# Patient Record
Sex: Male | Born: 1995 | Hispanic: No | Marital: Single | Smoking: Never smoker
Health system: Southern US, Academic
[De-identification: ages and names within clinical notes are randomized; demographics above are authoritative.]

---
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
## 2016-01-24 ENCOUNTER — Ambulatory Visit (INDEPENDENT_AMBULATORY_CARE_PROVIDER_SITE_OTHER): Payer: PRIVATE HEALTH INSURANCE

## 2016-01-24 ENCOUNTER — Encounter (INDEPENDENT_AMBULATORY_CARE_PROVIDER_SITE_OTHER): Payer: Self-pay

## 2016-01-24 VITALS — BP 103/90 | HR 90 | Temp 98.3°F | Resp 16 | Ht 70.0 in | Wt 149.4 lb

## 2016-01-24 DIAGNOSIS — J0100 Acute maxillary sinusitis, unspecified: Principal | ICD-10-CM

## 2016-01-24 MED ORDER — FLUTICASONE PROPIONATE 50 MCG/ACTUATION NASAL SPRAY,SUSPENSION
2.0000 | Freq: Every day | NASAL | 0 refills | Status: DC
Start: 2016-01-24 — End: 2016-03-05

## 2016-01-24 MED ORDER — AMOXICILLIN 875 MG-POTASSIUM CLAVULANATE 125 MG TABLET
1.00 | ORAL_TABLET | Freq: Two times a day (BID) | ORAL | 0 refills | Status: AC
Start: 2016-01-24 — End: 2016-01-31

## 2016-01-24 MED ORDER — FEXOFENADINE 180 MG TABLET
180.0000 mg | ORAL_TABLET | Freq: Every day | ORAL | 0 refills | Status: DC
Start: 2016-01-24 — End: 2017-08-09

## 2016-01-24 NOTE — Patient Instructions (Addendum)
[place]     Operated by [facility]  [place], [place] [address]  Phone: [phone]  Fax: [phone]  [email or web address]  Twitter [email or web address]   Closed all  Holidays        Attending Caregiver: [facility]    Today's orders:   Orders Placed This Encounter    fluticasone (FLONASE) 50 mcg/actuation Nasal Spray, Suspension    fexofenadine (ALLEGRA) 180 mg Oral Tablet    amoxicillin-pot clavulanate (AUGMENTIN) 875-125 mg Oral Tablet         Prescription(s) E-Rx to:  [place], [place]    Future appts with Student Health  Visit date not found    [place]. THIS IS ONE EASY WAY TO COMMUNICATE WITH [facility].  SEE THE CODE ON THIS FORM FOR ACCESS.    -----------------------------PRIVACY INFORMATION-----------------------------------------------  As a [place] student, regardless of your age, we cannot discuss your personal health information with a parent, spouse, family member or anyone else without your expressed consent.    This policy does not include:  Individuals who would have a legitimate reason to access your records and information to assist in your care under the provisions of HIPAA ([place]) law;   Individuals with whom you have previously given expressed written consent to do so, such a legal guardian or Power of Attorney.    No one can access your My[facility]Chart, unless you give them your account sign on and password. You will receive emails from [place].  This means anyone who has access to the email account you provided can see this notification. There will be no private medical information included in these emails. This notification of  new medical information  available in your My[facility]Chart, may be information that you do not want others to know.      _______________________________________________________________________    If your symptoms persist, worsen or you develop any new or concerning symptoms please call [place] for at [phone] for a follow up appointment.   If your symptoms are severe, go immediately to the emergency department or call 911.  Please check with your health insurance coverage for these types of visits.   Please keep all follow up visit recommended at today's visit.    If you received x-rays during your visit, be aware that the final and formal interpretation of those films by a radiologist will occur after your discharge.  If there is a significant discrepancy identified after your discharge, we will contact you at the telephone number provided during registration.  These results are available for your review on My[facility]Chart.    Please refer to your My[facility]Chart for lab test results. Lab test results related to HIV, STI's and pregnancy are considered private and are therefore not immediately visible in your My[facility]Chart, we may still be able to send a generic MyChart message for these results.  If you have cultures pending for sexually transmitted diseases, you will be contacted by phone.     Positive cultures are reported to the [place] Department of Health, as required by state law. These results are considered private on My[facility]Chart and can only be released by the provider.We will not contact you if the results are negative.    It is very important that we have a phone number that is the single best way to contact you in the event that we become aware of important clinical information  or concerns after your discharge.  If the phone number you provided at registration is NOT this number you should inform staff and registration prior to leaving.     Please call [facility] at [phone] with any further questions.    Instructions discussed with patient upon discharge by clinical staff with all questions answered.    Berdine Addison, DO 01/24/2016, 15:27    Sinusitis, Adult  Sinusitis is redness, soreness, and puffiness (inflammation) of the air pockets in the bones of your face (sinuses). The redness, soreness, and puffiness can cause air and mucus to get trapped in your sinuses. This can allow germs to grow and cause an infection.   HOME CARE    Drink enough fluids to keep your pee (urine) clear or pale yellow.   Use a humidifier in your home.   Run a hot shower to create steam in the bathroom. Sit in the bathroom with the door closed. Breathe in the steam 3-4 times a day.   Put a warm, moist washcloth on your face 3-4 times a day, or as told by your doctor.   Use salt water sprays (saline sprays) to wet the thick fluid in your nose. This can help the sinuses drain.   Only take medicine as told by your doctor.  GET HELP RIGHT AWAY IF:    Your pain gets worse.   You have very bad headaches.   You are sick to your stomach (nauseous).   You throw up (vomit).   You are very sleepy (drowsy) all the time.   Your face is puffy (swollen).   Your vision changes.   You have a stiff neck.   You have trouble breathing.  MAKE SURE YOU:    Understand these instructions.   Will watch your condition.   Will get help right away if you are not doing well or get worse.     This information is not intended to replace advice given to you by your health care provider. Make sure you discuss any questions you have with your health care provider.     Document Released: 05/17/2008 Document Revised: 12/20/2014 Document Reviewed: 07/04/2012  Elsevier Interactive Patient Education [employer]

## 2016-01-24 NOTE — Progress Notes (Addendum)
History of Present Illness: Jason Kennedy is a 20 y.o. male who presents to the Student [facility]/Urgent Care today with chief complaint of    Chief Complaint     Sinus Problem drainage.  Symptoms for about 1 week       Sore Throat             Location: sinus  Quality: congestion  Onset: week ago  Severity: mild  Timing: constant  Context: Pt presents to Student [facility] with c/o sinus congestion that began a week ago. Pt associates sore throat, productive cough, chills, and eye redness. Pt has been taking OTC medications Dayquil and Nyquil without relief.   Associated symptoms: eye redness, nasal congestion, sore throat    I reviewed and confirmed the patient's past medical history taken by the nurse or medical assistant with the addition of the following:    Past Medical History:    No past medical history on file.    Past Surgical History:    No past surgical history on file.    Allergies:  No Known Allergies  Medications:    Current Outpatient Prescriptions   Medication Sig    amoxicillin-pot clavulanate (AUGMENTIN) 875-125 mg Oral Tablet Take 1 Tab by mouth Every 12 hours for 7 days    DM/P-EPHED/ACETAMINOPH/DOXYLAM (NYQUIL ORAL) Take by mouth    DM/PSEUDOEPHED/ACETAMINOPHEN (VICKS DAYQUIL ORAL) Take by mouth    fexofenadine (ALLEGRA) 180 mg Oral Tablet Take 1 Tab (180 mg total) by mouth Once a day    fluticasone (FLONASE) 50 mcg/actuation Nasal Spray, Suspension 2 Sprays by Each Nostril route Once a day     Social History:    Social History   Substance Use Topics    Smoking status: Never Smoker    Smokeless tobacco: None    Alcohol use No     Family History: No significant family history.  Family History   Problem Relation Age of Onset    Healthy Father     Healthy Brother      Review of Systems:    General: chills  ENT: sore throat, sinus congestion  Eyes:  redness  Pulmonary:   productive cough  All other review of systems were negative    Physical Exam:  Vital signs:   Vitals:    01/24/16 1505   BP:  103/90   Pulse: 90   Resp: 16   Temp: 36.8 C (98.3 F)   TempSrc: Thermal Scan   SpO2: 97%   Weight: 67.8 kg (149 lb 6.4 oz)   Height: 1.778 m ( )     Body mass index is 21.44 kg/(m^2). 47 %ile (Z= -0.07) based on CDC 2-20 Years weight-for-age data using vitals from 01/24/2016.  No LMP for male patient.    General:  Well appearing and No acute distress  Head:  Normocephalic  Eyes:  Normal lids/lashes and normal conjunctiva  ENT:  normal EAC's, normal TM's, MMM, mildly erythematous turbinates, mildly erythematous pharynx with cobblestoning appearance, normal tonsils and normal tongue/uvula, maxillary sinus tenderness bilaterally   Neck:  supple  Pulmonary:  clear to auscultation bilaterally, no wheezes, no rales and no rhonchi  Cardiovascular:  regular rate/rhythm, normal S1/S2 and no murmur/rub/gallop  Skin:  warm/dry and no rash  Psychiatric:  Appropriate affect and behavior  Neurologic:   Alert and oriented x 3  Hem/Lymph:  No cervical lymphadenopathy     Data Reviewed:    Not applicable    Course: Condition at discharge: Good  Differential Diagnosis: viral sinusitis vs bacterial sinusitis vs URI     Assessment:   1. Acute non-recurrent maxillary sinusitis        Plan:    Orders Placed This Encounter    fluticasone (FLONASE) 50 mcg/actuation Nasal Spray, Suspension    fexofenadine (ALLEGRA) 180 mg Oral Tablet    amoxicillin-pot clavulanate (AUGMENTIN) 875-125 mg Oral Tablet         Most likely viral vs bacterial sinusitis  Pt educated on Rx usage and symptomatic care.   Discussed conservative tx including steam inhalation, neti-pot, tea with honey and humidifier  Pt only to take Augmentin if symptoms are not improving in the next 3-4 days.     Go to Emergency Department immediately for further work up if any concerning symptoms.  Plan was discussed and patient verbalized understanding.  If symptoms are worsening or not improving the patient should return to the Urgent Care/Student Health for further  evaluation.    I am scribing for, and in the presence of, Dr. Edwyna Shell for services provided on 01/24/2016.  Alba Destine, SCRIBE     Alba Destine, SCRIBE 01/24/2016, 15:28    I personally performed the services described in this documentation, as scribed  in my presence, and it is both accurate  and complete.    Berdine Addison, DO

## 2016-03-05 ENCOUNTER — Ambulatory Visit (INDEPENDENT_AMBULATORY_CARE_PROVIDER_SITE_OTHER): Payer: Self-pay | Admitting: EMERGENCY MEDICINE

## 2016-03-05 ENCOUNTER — Encounter (INDEPENDENT_AMBULATORY_CARE_PROVIDER_SITE_OTHER): Payer: Self-pay | Admitting: EMERGENCY MEDICINE

## 2016-03-05 VITALS — BP 119/80 | HR 76 | Temp 98.0°F | Resp 16 | Ht 70.0 in | Wt 150.0 lb

## 2016-03-05 DIAGNOSIS — Z7184 Encounter for health counseling related to travel: Secondary | ICD-10-CM

## 2016-03-05 DIAGNOSIS — Z7189 Other specified counseling: Secondary | ICD-10-CM

## 2016-03-05 NOTE — Patient Instructions (Signed)
[facility]  [place]   [facility] by [facility]  [address]  [place], [place] [id number]  Phone: [phone]  Fax: [phone]  [email or web address]  Closed all  Blunt Holidays          Attending Caregiver: Jama FlavorsKathleen H Marisabel Macpherson, MD    Today's orders: No orders of the defined types were placed in this encounter.        Prescription(s) E-Rx to:  [facility] - [place], [place] - [address]    Future appts with the Travel Clinic  Visit date not found    It is very important that we have a primary phone number as well as an emergency contact in your chart.  This is the single best way to contact you in the event that we become aware of important clinical information or concerns after your discharge.  If the phone number you provided at registration is NOT this number you should inform staff and registration prior to leaving.      Your treatment plan today was focused on identifying and preventing potential illnesses that you may encounter on your travel as well as identifying any specific travel needs.  You may have additional visits scheduled to complete any necessary vaccinations as well as medications that were sent to the pharmacy.         If you have been prescribed a medication to prevent Malaria, please note that no medication is 100% effective and consistent use of insect repellant, appropriate clothing, and environmental barriers are vital while traveling day and night.    There are always potential side effects and interactions with any medication or vaccination.  If you develop any concerning symptoms, please contact the clinic immediately or go to the nearest emergency room for evaluation.    If you received x-rays during your visit, be aware that the final and formal interpretation of those films by a radiologist may occur after your discharge.  If there is a significant discrepancy identified after your discharge,  we will contact you at the telephone number provided at registration.  You will also be identified of any lab findings as we become aware.    If you are over 19 year old, we cannot discuss your personal health information with a parent, spouse, family member, or anyone else without your consent.  This does not include those who have legitimate access to your records and information to assist in your care under the provisions of HIPAA [facility](Health Insurance Portability and Accountability Act) law, or those to whom you have previously given written consent to do so, such a legal guardian or Power of [place].      Instructions are discussed with patient upon discharge by clinical staff with all questions answered.  Please call [facility] [phone]) if any further questions develop.      If you develop any symptoms prior to travel or immediately following travel, please contact your primary care physician or go to your nearest emergency department to be evaluated.  Be sure to tell your health care provider about your travel.    Jama FlavorsKathleen H Jovonna Nickell, MD 03/05/2016, 08:30

## 2016-03-05 NOTE — Progress Notes (Signed)
Travel, Health/Educ Bldg  [address]  [place] [id number]  [phone]  Travel Clinic    Patient Name:  Jason Kennedy       MRN:  [id number]   DOB: 05/22/1996   Date of Service:  03/05/2016     Date of Departure: 04/16/2016  [place]  Length of Stay: 10 day   Living Area: Urban  Living Styles:Hotel    Allergies: No Known Allergies  Allergic to chicken feathers/eggs:no    History reviewed. No pertinent past medical history.  Seasonal allergies    History reviewed. No pertinent surgical history.      Family History   Problem Relation Age of Onset    Healthy Father     Healthy Brother     Healthy Mother          Social History   Substance Use Topics    Smoking status: Never Smoker    Smokeless tobacco: Never Used    Alcohol use No       Current Medications:  Outpatient Prescriptions Marked as Taking for the 03/05/16 encounter (Office Visit) with Jama Flavorsichardson, Ezabella Teska H, MD   Medication Sig    fexofenadine (ALLEGRA) 180 mg Oral Tablet Take 1 Tab (180 mg total) by mouth Once a day       Immunizations:    There is no immunization history on file for this patient.    Is Patient currently under the care of a physician: no  Patient has a history of : N/A  Does patient feel ill today: No    Patients gender:  Male   Vitals:    03/05/16 0816   BP: 119/80   Pulse: 76   Resp: 16   Temp: 36.7 C (98 F)   TempSrc: Thermal Scan   SpO2: 98%   Weight: 68 kg (150 lb)   Height: 1.778 m (5\' 10" )     Body mass index is 21.52 kg/(m^2).  No LMP for male patient.      Instructions/Counseling: Safety and Security  Patient understands all counseling and risks of travel and medication.      Handouts: Travax- specific to destination and Culturegram- specific to destination     Immunizations:   There is no immunization history on file for this patient.    Additional Information: No vaccines given today.    No orders of the defined types were placed in this encounter.      (Z71.89) Travel advice  encounter  (primary encounter diagnosis)  Plan:       Discussed general safety and security including traveling in group and avoidance of alcohol and drugs. Advised to wear helmet and seatbelt when appropriate. Up to date on required immunizations per her record.           Recommended Follow-Up Instructions:  N/A    Billing level for today's visit Travel established group without exam    Jama FlavorsKathleen H Maliaka Brasington, MD 03/05/2016, 08:32

## 2016-03-07 ENCOUNTER — Other Ambulatory Visit: Payer: Self-pay

## 2016-08-09 ENCOUNTER — Encounter (INDEPENDENT_AMBULATORY_CARE_PROVIDER_SITE_OTHER): Payer: Self-pay

## 2016-08-09 ENCOUNTER — Ambulatory Visit (INDEPENDENT_AMBULATORY_CARE_PROVIDER_SITE_OTHER): Payer: PRIVATE HEALTH INSURANCE

## 2016-08-09 VITALS — BP 130/78 | HR 103 | Temp 97.0°F | Resp 18 | Ht 70.0 in | Wt 143.3 lb

## 2016-08-09 DIAGNOSIS — L723 Sebaceous cyst: Secondary | ICD-10-CM

## 2016-08-09 DIAGNOSIS — L089 Local infection of the skin and subcutaneous tissue, unspecified: Secondary | ICD-10-CM

## 2016-08-09 MED ORDER — AMOXICILLIN 875 MG-POTASSIUM CLAVULANATE 125 MG TABLET
1.00 | ORAL_TABLET | Freq: Two times a day (BID) | ORAL | 0 refills | Status: DC
Start: 2016-08-09 — End: 2017-08-09

## 2016-08-09 NOTE — Patient Instructions (Addendum)
Epidermoid Cyst (Sebaceous Cyst), Infected (Antibiotic Treatment)  You have an epidermoid cyst. This is a small, painless lump under your skin. An epidermoid cyst(often called a sebaceous cyst, epidermal cyst, or epidermal inclusion cyst) is a term most often used for 2 similar types of cysts:   Epidermoid cysts. These cysts form slowly under the skin. They can be found on most parts of the body. But they are most often found on areas with more hair such as the scalp, face, upper back, and genitals.   Pilar cysts. These are similar to epidermoid cysts. But they start from a different part of the hair follicle. They are more likely to be on the scalp.  Here are some general factsabout these cysts:   A cyst is a sac filled with material that is often cheesy, fatty, oily, or stringy. The material inside can be thick. Or it can be a liquid.   You can usually move the cyst slightly if you try.   The cysts can be smaller than a pea or as large as a few inches.   The cysts are usually not painful, unless they become inflamed or infected.   The area around the cyst may smell bad. If the cyst breaks open, the material inside it often smells bad as well.  Your cyst became infected and your healthcare provider wanted to treat it with antibiotics. If the antibiotics dont clear up the infection, the cyst will need to be drained by making a small cut (incision). Local anesthesia will be used to numb the area.  Home care   Resist the temptation to squeeze or pop the cyst, stick a needle in it, or cut it open. This often leads to a worsening infection and scarring.   Take the antibiotic as directed until it is all used up.   Soak the affected area in hot water or apply a hot pack (a thin, clean towel soaked in hot water) for 20 minutes at a time. Do this 3 to 4 times a day.   Apply antibiotic cream or ointment 3 times a day.   You may use over-the-counter pain medicine to control pain, unless another medicine was  given. If you have chronic liver or kidney disease or ever had a stomach ulcer or GI bleeding, talk with your healthcare provider before using these medicines.  Prevention  Once this infection has healed, reduce the risk of future infections by:   Keeping the cyst area clean by bathing or showering daily   Avoiding tight-fitting clothing in the cyst area  Follow-up care  Follow up with your healthcare provider, or as advised. If a gauze packing was put in your wound, it should be removed in a few days as advised by your healthcare provider. Check your wound every day for the signs listed below.  When to seek medical advice  Call your healthcare provider right away if any of these occur:   Pus coming from the cyst   Increasing redness around the wound   Increasing local pain or swelling   Fever of 100.23F (38C) or higher, or as directed by your provider  Date Last Reviewed: 09/17/2015   2000-2017 [employer] [address], [place], [place] [id number]. All rights reserved. This information is not intended as a substitute for professional medical care. Always follow your healthcare professional's instructions.        [facility]     [facility] by [facility]  [address]  [address]  [place], [place] [address]  Phone: [phone]  Fax: [phone]  [email or web address]  Twitter [email or web address]   Closed all  Holidays        Attending Caregiver: [facility]    Today's orders:   Orders Placed This Encounter    amoxicillin-pot clavulanate (AUGMENTIN) 875-125 mg Oral Tablet         Prescription(s) E-Rx to:  [facility] - [place], [place] - [address]    Future appts with Student Health  Visit date not found    PLEASE ACTIVATE YOUR [place] MYCHART. THIS IS ONE EASY WAY TO COMMUNICATE WITH [facility].  SEE THE CODE ON THIS FORM FOR ACCESS.    -----------------------------PRIVACY  INFORMATION-----------------------------------------------  As a [place] student, regardless of your age, we cannot discuss your personal health information with a parent, spouse, family member or anyone else without your expressed consent.    This policy does not include:  Individuals who would have a legitimate reason to access your records and information to assist in your care under the provisions of HIPAA [facility] Portability and Accountability Act) law;   Individuals with whom you have previously given expressed written consent to do so, such a legal guardian or Power of [address].    No one can access your My[facility]Chart, unless you give them your account sign on and password. You will receive emails from MYWVUChart.  This means anyone who has access to the email account you provided can see this notification. There will be no private medical information included in these emails. This notification of  new medical information  available in your My[facility]UChart, may be information that you do not want others to know.   _______________________________________________________________________    If your symptoms persist, worsen or you develop any new or concerning symptoms please call [facility] for at [phone] for a follow up appointment.   If your symptoms are severe, go immediately to the emergency department or call 911.  Please check with your health insurance coverage for these types of visits.   Please keep all follow up visit recommended at today's visit.    If you received x-rays during your visit, be aware that the final and formal interpretation of those films by a radiologist will occur after your discharge.  If there is a significant discrepancy identified after your discharge, we will contact you at the telephone number provided during registration.  These results are available for your review on My[facility]Chart.    Please refer to your My[facility]UChart for lab test results. Lab test results related to  HIV, STI's and pregnancy are considered private and are therefore not immediately visible in your MyWVUChart, we may still be able to send a generic MyChart message for these results.  If you have cultures pending for sexually transmitted diseases, you will be contacted by phone.     Positive cultures are reported to the [facility] Department of Health, as required by state law. These results are considered private on My[facility]UChart and can only be released by the provider.We will not contact you if the results are negative.    It is very important that we have a phone number that is the single best way to contact you in the event that we become aware of important clinical information or concerns after your discharge.  If the phone number you provided at registration is NOT this number you should inform staff and registration prior to leaving.     Please call [facility] [facility] at [phone] with any further questions.    Instructions discussed with patient upon discharge by clinical staff with all questions answered.    Darliss Ridgelodd Owen Hugh Kamara, MD 08/09/2016, 14:33

## 2016-08-09 NOTE — Progress Notes (Signed)
History of Present Illness: Jason Kennedy is a 20 y.o. male who presents to the [facility] today with chief complaint of    Chief Complaint     Cyst           .   Pt presents to [facility] with c/o a lump on scrotum onset yesterday. He associates soreness to the touch and redness. Pt denies fever, chills, drainage, dysuria, urinary frequency, and urethral discharge. He reports that he has not had any known injuries or a Hx of similar sxs. Pt notes that he has applied warm compresses without improvement and that he has not taken any OTC medications for pain relief.    Functional Health Screening:     Patient is under 18:  No   Have you had a recent unexplained weight loss or gain?:  No   Because we are aware of abuse and domestic violence today, we ask all patients: Are you being hurt, hit, or frightened by anyone at your home or in your life?:  No   Do you have any basic needs within your home that are not being met? (such as Food, Shelter, Games developer, Transportation):  No   Patient is under 18 and therefore has no Advance Directives:  No   Patient has:  No Advance   Patient has Advance Directive:  No   Patient offered:  Refused Packet          I reviewed and confirmed the patient's past medical history taken by the nurse or medical assistant with the addition of the following:    Past Medical History:    No past medical history on file.      Past Surgical History:    History reviewed. No pertinent surgical history.      Allergies:  No Known Allergies  Medications:    Current Outpatient Prescriptions   Medication Sig    amoxicillin-pot clavulanate (AUGMENTIN) 875-125 mg Oral Tablet Take 1 Tab by mouth Every 12 hours    fexofenadine (ALLEGRA) 180 mg Oral Tablet Take 1 Tab (180 mg total) by mouth Once a day     Social History:    Social History   Substance Use Topics    Smoking status: Never Smoker    Smokeless tobacco: Never Used    Alcohol use No     Family History: No significant family  history.  Family History   Problem Relation Age of Onset    Healthy Father     Healthy Brother     Healthy Mother        Review of Systems:    General: no fever and no chills  Genitourinary:  Lump on scrotum, soreness, redness, no dysuria, no frequency and no drainage, and no urethral discharge  All other review of systems were negative    Physical Exam:  Vital signs:   Vitals:    08/09/16 1401   BP: 130/78   Pulse: (!) 103   Resp: 18   Temp: 36.1 C (97 F)   TempSrc: Tympanic   SpO2: 99%   Weight: 65 kg (143 lb 4.8 oz)   Height: 1.778 m (5' 10" )     Body mass index is 20.56 kg/(m^2). 33 %ile (Z= -0.43) based on CDC 2-20 Years weight-for-age data using vitals from 08/09/2016.  No LMP for male patient.    General:  No acute distress  Head:  Normocephalic and atraumatic   Eyes:  Normal lids/lashes, EOMI and normal conjunctiva  Pulmonary:  Equal breath sounds, no wheezes, no rales and no rhonchi   Cardiovascular:  Regular rate/rhythm without murmur, normal radial pulses without peripheral edema    Genitourinary :1 cm cystic subcutaneous nodule at the R upper anterior aspect of scrotum which is mildly erythematous, indurated, nonfluctuant, and no drainage, otherwise normal male genitalia  Skin:  No visible rash on exposed skin  Psychiatric:  Appropriate affect and behavior  Neurologic:   Alert and oriented x 3    Data Reviewed:    Not applicable    Course: Condition at discharge: Good     Differential Diagnosis: sebaceous cyst vs infected sebaceous cyst vs abscess    Assessment:   1. Infected sebaceous cyst        Plan:    Orders Placed This Encounter    amoxicillin-pot clavulanate (AUGMENTIN) 875-125 mg Oral Tablet        Advised to take warm soaks and apply warm compresses.  Education on Augmentin usage and OTC pain management medications.  See printed discharge instructions.    Go to Emergency Department immediately for further work up if any concerning symptoms.  Plan was discussed and patient verbalized  understanding.  If symptoms are worsening or not improving the patient should return to the Urgent Care/Student [facility] for further evaluation.    I am scribing for, and in the presence of, Dr. Vergia Alcon for services provided on 08/09/2016.  Hallie Gunnoe, SCRIBE       [place], [place] 08/09/2016, 14:43    I personally performed the services described in this documentation, as scribed  in my presence, and it is both accurate  and complete.    Axel Filler, MD

## 2016-12-12 ENCOUNTER — Other Ambulatory Visit: Payer: Self-pay

## 2016-12-31 ENCOUNTER — Ambulatory Visit (INDEPENDENT_AMBULATORY_CARE_PROVIDER_SITE_OTHER): Payer: PRIVATE HEALTH INSURANCE

## 2016-12-31 ENCOUNTER — Encounter (INDEPENDENT_AMBULATORY_CARE_PROVIDER_SITE_OTHER): Payer: Self-pay

## 2016-12-31 VITALS — BP 114/79 | HR 91 | Temp 98.0°F | Resp 16 | Ht 71.0 in | Wt 149.9 lb

## 2016-12-31 DIAGNOSIS — R6889 Other general symptoms and signs: Principal | ICD-10-CM

## 2016-12-31 MED ORDER — ONDANSETRON 4 MG DISINTEGRATING TABLET
4.0000 mg | ORAL_TABLET | Freq: Three times a day (TID) | ORAL | 0 refills | Status: DC | PRN
Start: 2016-12-31 — End: 2017-04-11

## 2016-12-31 NOTE — Progress Notes (Addendum)
Attending: Dr. Ladonna Snide  History of Present Illness: Jason Kennedy is a 21 y.o. male who presents to the [facility] today with chief complaint of    Chief Complaint     Fever     Vomiting     Generalized Body Aches             Pt presents to [facility] with c/o flu-like symptoms that began 2 days ago. Pt states he has felt febrile with chills. He vomited once last night. Pt admits that he has not had an appetite but has tried to increase water intake. He states that with eating and drinking he feels nauseated. Pt associates otalgia, myalgias and headache. Pt denies any known sick contacts. He did not receive the influenza vaccination. He has taken Dayquil with mild relief. Pt denies cough, sore throat, sinus congestion, and diarrhea.     On average, how many days/week do you engage in moderate to vigorous physical activity (like brisk walking)?: 5 Days  On average, how many minutes do you enage in physical activity at this level?: 30-45 Minutes/day  Total activity days/week x minutes/day =: 225    Functional Health Screening:     Patient is under 18:  No   Have you had a recent unexplained weight loss or gain?:  No   Because we are aware of abuse and domestic violence today, we ask all patients: Are you being hurt, hit, or frightened by anyone at your home or in your life?:  No   Do you have any basic needs within your home that are not being met? (such as Food, Shelter, Games developer, Transportation):  No   Patient is under 18 and therefore has no Advance Directives:  No   Patient has:  No Advance   Patient has Advance Directive:  No   Patient offered:  Refused Packet        I reviewed and confirmed the patient's past medical history taken by the nurse or medical assistant with the addition of the following:    Past Medical History:    History reviewed. No pertinent past medical history.    Past Surgical History:    Past Surgical History Pertinent Negatives:   Procedure Date Noted   . HX  TONSILLECTOMY 09/11/2015       Allergies:  No Known Allergies  Medications:    Current Outpatient Prescriptions   Medication Sig   . amoxicillin-pot clavulanate (AUGMENTIN) 875-125 mg Oral Tablet Take 1 Tab by mouth Every 12 hours   . fexofenadine (ALLEGRA) 180 mg Oral Tablet Take 1 Tab (180 mg total) by mouth Once a day   . ondansetron (ZOFRAN ODT) 4 mg Oral Tablet, Rapid Dissolve Take 1 Tab (4 mg total) by mouth Every 8 hours as needed for nausea/vomiting     Social History:    Social History   Substance Use Topics   . Smoking status: Never Smoker   . Smokeless tobacco: Never Used   . Alcohol use No     Family History: No significant family history.  Family Medical History     Problem Relation (Age of Onset)    Healthy Father, Brother, Mother        Review of Systems:    General: subjective fever, chills, myalgias, decreased appetite  ENT:  Otalgia, no sore throat, no sinus congestion  Pulmonary:  No cough  Gastrointestinal: nausea, vomiting x1, no diarrhea  Neurologic: headache  All other review of systems were negative  Physical Exam:  Vital signs:   Vitals:    12/31/16 1414   BP: 114/79   Pulse: 91   Resp: 16   Temp: 36.7 C (98 F)   TempSrc: Thermal Scan   SpO2: 98%   Weight: 68 kg (149 lb 14.4 oz)   Height: 1.803 m (5' 11" )     Body mass index is 20.91 kg/(m^2). 42 %ile (Z= -0.20) based on CDC 2-20 Years weight-for-age data using vitals from 12/31/2016.  No LMP for male patient.    General:  Well appearing and no acute distress  Head:  Normocephalic  Eyes:  Normal lids/lashes and normal conjunctiva  ENT:  Normal EAC's, normal TM's, MMM, normal pharynx and normal tongue/uvula  Neck:  Supple   Pulmonary:  Clear to auscultation bilaterally, no wheezes, no rales and no rhonchi  Cardiovascular:  Regular rate/rhythm, normal S1/S2 and no murmur/rub/gallop  Gastrointestinal:  Non-distended, normal bowel sounds, soft, non-tender, and no guarding  Skin:  Warm/dry and no rash  Psychiatric:  Appropriate affect and  behavior  Neurologic:   Alert and oriented  Hem/Lymph:  No cervical lymphadenopathy     Data Reviewed:    Point-of-care testing:     Rapid Influenza A: Negative  Rapid Influenza B: Negative                    Course: Condition at discharge: Good     Differential Diagnosis: influenza vs viral gastroenteritis vs bacterial gastroenteritis     Assessment:   1. Flu-like symptoms        Plan:    Orders Placed This Encounter   . POCT INFLUENZA A & B   . ondansetron (ZOFRAN ODT) 4 mg Oral Tablet, Rapid Dissolve         Educated on Rx usage and symptomatic care such as increase fluids, rest, and tylenol.   Given excuse for school.     Go to Emergency Department immediately for further work up if any concerning symptoms.  Plan was discussed and patient verbalized understanding.  If symptoms are worsening or not improving the patient should return to the [facility] for further evaluation.    I am scribing for, and in the presence of, Bruna Potter, DNP for services provided on 12/31/2016.  Sanjuana Mae, SCRIBE     Sanjuana Mae, SCRIBE 12/31/2016, 14:52  I personally performed the services described in this documentation, as scribed  in my presence, and it is both accurate  and complete.    Beth Vicki Mallet, APRN,NP-C  The supervising physician was physically present in Urgent Care and available for consultation, and did not participate in the care of this patient.

## 2016-12-31 NOTE — Patient Instructions (Signed)
Influenza (Adult)    Influenza is also called the flu. It is a viral illness that affects the air passages of your lungs. It is different from the common cold. The flu can easily be passed from one to person to another. It may be spread through the air by coughing and sneezing. Or it can be spread by touching the sick person and then touching your own eyes, nose, or mouth.  The flu starts 1 to 3 days after you are exposed to the flu virus. It may lastfor 1 to 2 weeks but many people feel tired or fatigued for many weeks afterward. You usually don't need to take antibiotics unless you have a complication. This might be an ear or sinus infection or pneumonia.  Symptoms of the flu may be mild or severe. They can include extreme tiredness (wanting to stay in bed all day), chills, fevers, muscle aches, soreness with eye movement, headache, and a dry, hacking cough.  Home care  Follow these guidelines when caring for yourself at home:   Avoid being around cigarette smoke, whether yours or other people's.   Acetaminophen or ibuprofen will help ease your fever, muscle aches, and headache. Don't give aspirin to anyone younger than 30 who has the flu. Aspirin can harm the liver.   Nausea and loss of appetite are common with the flu. Eat light meals. Drink 6 to 8 glasses of liquids every day. Good choices are water, sport drinks, soft drinks without caffeine, juices, tea, and soup. Extra fluids will also help loosen secretions in your nose and lungs.   Over-the-counter cold medicines will not make the flu go away faster. But the medicines may help with coughing, sore throat, and congestion in your nose and sinuses. Don't use a decongestant if you have high blood pressure.   Stay home until your fever has been gone for at least 24 hours without using medicine to reduce fever.  Follow-up care  Follow up with your healthcare provider, or as advised, if you are not getting better over the next week.  If you are age 41 or  older, talk with your provider about getting a pneumococcal vaccine every 5 years. You should also get this vaccine if you have chronic asthma or COPD. All adults should get a flu vaccine every fall. Ask your provider about this.  When to seek medical advice  Call your healthcare provider right away if any of these occur:   Cough with lots of colored mucus (sputum) or blood in your mucus   Chest pain, shortness of breath, wheezing, or trouble breathing   Severe headache, or face, neck, or ear pain   New rashwith fever   Fever of 100.2F (38C)or higher, or asdirected by your healthcare provider   Confusion, behavior change, or seizure   Severe weakness or dizziness   You get a newfever or cough after getting better for a few days  Date Last Reviewed: 12/14/2015   2000-2017 [employer], [place] [address]. All rights reserved. This information is not intended as a substitute for professional medical care. Always follow your healthcare professional's instructions.     [facility]      Operated by [facility]  [address] [place], [place] [address]  Phone: [phone])  Fax: [phone]  [email or web address]  Open Daily 8:00a - 8:00p    Closed Thanksgiving and Christmas Day  [facility]  Operated by [facility]  [address]  [facility]  [place], [place] [address]  Phone: [phone])  Fax: [phone]  [email or web address]  Open M-F  8:00a - 8:00p  Sat              10:00a - 4:00p  Sun             Closed  Closed all [place] Holidays        Attending Caregiver: Verta Ellen, APRN,NP-C      Today's orders:   Orders Placed This Encounter   . POCT INFLUENZA A & B   . ondansetron (ZOFRAN ODT) 4 mg Oral Tablet, Rapid Dissolve        Prescription(s) E-Rx to:  [facility] - [place], [place] - [address]     ________________________________________________________________________  Short Term Disability and Family Medical Leave Act  [facility] does NOT provide assistance with any disability applications.  If you feel your medical condition requires you to be on disability, you will need to follow up with  your primary care physician or a specialist.  We apologize for any inconvenience.    For Medication Prescribed by [facility]:  As an Urgent Care facility, our clinic does NOT offer prescription refills over the telephone.    If you need more of the medication one of our medical providers prescribed, you will  either need to be re-evaluated by [place] or see your primary care physician.    ________________________________________________________________________      It is very important that we have a phone number.  This is the single best way to contact you in the event that we become aware of important clinical information or concerns after your discharge.  If the phone number you provided at registration is NOT this number you should inform staff and registration prior to leaving.      Your treatment and evaluation today was focused on identifying and treating potentially emergent conditions based on your presenting signs, symptoms, and history.  The resulting initial clinical impression and treatment plan is not intended to be definitive or a substitute for a full physical examination and evaluation by your primary care provider.  If your symptoms persist, worsen, or you develop any new or concerning symptoms, you need to be evaluated.      If you received x-rays during your visit, be aware that the final and formal interpretation of those films by a radiologist may occur after your discharge.  If there is a significant discrepancy identified after your discharge, we will contact you at the telephone number provided at registration.      If you received a pelvic exam, you may have cultures pending for sexually  transmitted diseases.  Positive cultures are reported to the [facility] Department of Health as required by state law.  You may contact the Health Information Management Office of [facility] to get a copy of your results.     If you are over 49 year old, we cannot discuss your personal health information with a parent, spouse, family member, or anyone else without your consent.  This does not include those who have legitimate access to your records and information to assist in your care under the provisions of HIPAA [facility] Portability and Accountability Act) law, or those to whom you have previously given written consent to do so, such a legal guardian or Power of [place].      Instructions are  discussed with patient upon discharge by clinical staff with all questions answered.  Please call [facility] [phone]) if any further questions develop.  Go immediately to the emergency department if any concerns or worsening symptoms.      Verta EllenBeth Ann Zoriah Pulice, APRN,NP-C 12/31/2016, 14:39

## 2017-04-11 ENCOUNTER — Ambulatory Visit (INDEPENDENT_AMBULATORY_CARE_PROVIDER_SITE_OTHER): Payer: Self-pay | Admitting: Emergency Medicine

## 2017-04-11 ENCOUNTER — Encounter (INDEPENDENT_AMBULATORY_CARE_PROVIDER_SITE_OTHER): Payer: Self-pay | Admitting: Emergency Medicine

## 2017-04-11 VITALS — BP 120/88 | HR 51 | Temp 98.3°F | Resp 18 | Ht 70.0 in | Wt 154.8 lb

## 2017-04-11 DIAGNOSIS — Z7184 Encounter for health counseling related to travel: Secondary | ICD-10-CM

## 2017-04-11 DIAGNOSIS — Z7189 Other specified counseling: Secondary | ICD-10-CM

## 2017-04-11 DIAGNOSIS — Z23 Encounter for immunization: Secondary | ICD-10-CM

## 2017-04-11 MED ORDER — AZITHROMYCIN 500 MG TABLET
ORAL_TABLET | ORAL | 0 refills | Status: DC
Start: 2017-04-11 — End: 2017-08-09

## 2017-04-11 NOTE — Patient Instructions (Signed)
[facility]  [place]   Operated by [facility]  [place] [address]  Phone: [phone]  Fax: [phone]  [email or web address]  Closed all  [place] Holidays        Please check with your primary care provider to see if you've had the Hepatitis A vaccine series (x2). Also, check to see when your tetanus booster (TdaP, not Td) is due - it's given every 10 years and you likely will need it next year (but maybe not).    Attending Caregiver: Ladonna Snide, MD    Today's orders:   Orders Placed This Encounter   . TYPHOID VACCINE -INJECTION(ADMIN)   . Azithromycin (ZITHROMAX) 500 mg Oral Tablet         Prescription(s) E-Rx to:  [place], [place]    Future appts with the Travel Clinic  Visit date not found    It is very important that we have a primary phone number as well as an emergency contact in your chart.  This is the single best way to contact you in the event that we become aware of important clinical information or concerns after your discharge.  If the phone number you provided at registration is NOT this number you should inform staff and registration prior to leaving.      Your treatment plan today was focused on identifying and preventing potential illnesses that you may encounter on your travel as well as identifying any specific travel needs.  You may have additional visits scheduled to complete any necessary vaccinations as well as medications that were sent to the pharmacy.         If you have been prescribed a medication to prevent Malaria, please note that no medication is 100% effective and consistent use of insect repellant, appropriate clothing, and environmental barriers are vital while traveling day and night.    There are always potential side effects and interactions with any medication or vaccination.  If you develop any concerning symptoms, please contact  the clinic immediately or go to the nearest emergency room for evaluation.    If you received x-rays during your visit, be aware that the final and formal interpretation of those films by a radiologist may occur after your discharge.  If there is a significant discrepancy identified after your discharge, we will contact you at the telephone number provided at registration.  You will also be identified of any lab findings as we become aware.    If you are over 100 year old, we cannot discuss your personal health information with a parent, spouse, family member, or anyone else without your consent.  This does not include those who have legitimate access to your records and information to assist in your care under the provisions of HIPAA ([place]) law, or those to whom you have previously given written consent to do so, such a legal guardian or Power of [place].      Instructions are discussed with patient upon discharge by clinical staff with all questions answered.  Please call [facility] [phone]) if any further questions develop.      If you develop any symptoms prior to travel or immediately following travel, please contact your primary care physician or go to your nearest emergency department to be evaluated.  Be sure to tell your health care provider about your travel.    Ladonna Snide, MD 04/11/2017, 13:32  While abroad, consider checking the ISTM.org website for local clinics.    The CDC "Yellow Book" has all sorts of travel information. Please check it out online at:  VoipAssistance.fr    Malaria information/prophylaxis: [email or web address].cy    Traveling Outside the [place]   See your doctor at least 4 - 6 weeks before your trip. This allows time for immunizations to take effect. If it is less than 4 weeks before you leave, you should still see  your caregiver. You might still benefit from shots or medicines and information about how to protect yourself while traveling.   Your caregiver will ask you where you intend to travel, how long you intend to stay, and whether you may visit rural areas. This determines what vaccinations should be considered. Know your travel schedule when you visit your caregiver.   Adolescents and children should seek guidance on their vaccination status from their caregiver. So should women who are breastfeeding or pregnant, and people with altered immunity (HIV/AIDS, diabetes).  CDC RECOMMENDS THE FOLLOWING VACCINES (AS NEEDED BY AGE AND BY WORLD REGION):   Routine Vaccines: Be up to date on your routine vaccinations. Get boosters, if needed. These include diphtheria, tetanus, and pertussis (DPT), measles, mumps, and rubella (MMR), influenza (flu), and varicella (chickenpox). Also, meningococcal, if you are 9 to 20 years of age, and zoster (shingles) if over age 75. Possibly pneumococcal, if you are a smoker or have long-term (chronic) lung or heart disease.   Typhoid: If you are visiting low income or developing countries.   Yellow Fever: If traveling to an area where the disease is prevalent (endemic).   HPV: (Human Papilloma Virus), if you are 12 years old or younger and intend to be sexually active.   Rabies: If you might be exposed to wild or domestic animals. Pre-exposure rabies vaccine is urged for people doing more than short-term travel in countries where rabies is common (including [place]).   Polio: A single one-time booster is advised for travel to [place] and [place].   Hepatitis A: This is routinely given to children beginning at age 70 years. It is often advised for most foreign travel, including [place].   Hepatitis B: This is given routinely to infants, children, and adolescents.   Meningococcal: This is advised for travel to developing countries, where risk is high. For example, parts of  sub-Saharan [place] ("meningitis belt"). [place] requires vaccine for all pilgrims attending the Hajj (religious travel to [place]).   Malaria: A vaccine does not yet exist. Oral medicines can prevent the usual types of malaria and drug-resistant strains. The most common medicine prescribed is LARIAM (mefloquine). It is taken once weekly before, during, and after travel. Other drugs are also used for malaria prevention, including chloroquine and doxycycline.   Japanese B Encephalitis (JE): This is a moderately toxic vaccine. Use is generally limited to travelers to [place], who will have long rural exposure to mosquitoes, in areas with high likelihood of disease spreading (such as, rice paddies).  TO STAY HEALTHY, DO:   Wash hands often, with soap and water.   Drink only bottled or boiled water, or carbonated (bubbly) drinks in cans or bottles. Avoid tap water, fountain drinks, and ice cubes. If not possible, make water safer by BOTH filtering through an "absolute 1-micron or less" filter AND adding iodine tablets. Such filters are found in camping and outdoor supply stores.   When buying carbonated drinks or bottled water, always inspect the bottle seal. Make sure it has  not been previously opened. This could mean it was refilled with unclean beverages or water. If you suspect a bottle seal has been tampered with, return or discard it.   Eat only thoroughly cooked food from a reputable restaurant or food service provider, who routinely caters to foreign travelers. Only eat meat, fish, or shellfish that have been thoroughly cooked. Otherwise, they can infect you and cause gastroenteritis.   Avoid foods that have been prepared and left standing at room temperature. These often support bacterial growth that can make you ill.   If you will be visiting an area where there is risk for malaria, take your malaria prevention medicine before, during, and after travel, as directed. (See your doctor for a prescription.)    Protect yourself from mosquito bites:   Pay special attention to mosquito protection between dusk and dawn. This is when malaria-carrying mosquitos are active.   Wear long-sleeved shirts, long pants, and hats.   Use insect repellants that contain DEET (diethylmethyltoluamide).   Read and follow the directions and precautions on the product label.   Apply insect repellent to all exposed skin.   Do not put repellent on wounds or broken skin.   Do not breathe in, swallow, or get DEET in your eyes. DEET is toxic if swallowed. If using a spray product, apply DEET to your face by spraying your hands and rubbing the product carefully over the face. Avoid your eyes and mouth.   Purchase a bed net impregnated (treated) with the insecticide permethrin or deltamethrin. Or, spray the bed net with one of these insecticides. This is not needed if you are staying in air-conditioned or well-screened housing.   DEET may be used on adults, children, and infants older than 73 months of age. Protect infants by using a carrier draped with mosquito netting, with an elastic edge for a tight fit.   Children under 31 years old should not apply insect repellent themselves. Do not apply to young children's hands or around their eyes and mouth.   If you are visiting areas where malaria occurs, read the malaria prevention recommendations on the CDC malaria website (DesMoinesFuneral.dk). Your caregiver will guide you on the selection and use of an anti-malaria preventive medicine that you may need to take before, during, and after your visit.   To prevent fungal and parasitic infections, keep feet clean and dry. Do not go barefoot.   Always use condoms to reduce the risk of HIV and other sexually transmitted diseases.   Try to travel in vehicles that have seat belts, whenever possible. If renting a vehicle, try to rent a larger one for added protection. Wear helmets whenever bicycling or motorcycling. Avoid alcohol when operating  any vehicle, even a bicycle. Avoid overcrowded, over-weighted, or top heavy buses or mini-vans. Be aware that pedestrian patterns vary greatly by country.  TO AVOID GETTING SICK:   Do not eat food purchased from street vendors.   Eat at restaurants that often cater to foreign travelers (leading hotels, hotel chains).   Do not drink beverages with ice.   Do not eat dairy products, unless you know they have been pasteurized.   Do not share needles with anyone.   Do not handle animals (especially monkeys, dogs, and cats). Avoid bites and serious diseases (including rabies and plague).   Do not swim in fresh water. Salt water is often safer. Avoid swimming pools that are not chlorinated.   Do not have unprotected sex.  WHAT YOU NEED TO  BRING WITH YOU:   Long-sleeved shirt, long pants, and a hat to wear outside. This is to prevent illnesses carried by insects (malaria, dengue, filariasis, leishmaniasis, onchocerciasis).   Contact information card, for use in urgent situations. This should list the names, addresses, and telephone numbers of family member(s) or contact(s) in your country, your primary caregivers, important specialty home caregivers, area hospitals and clinics where you will travel, and your national consulate or embassy.   Purchase a pre-packaged travel health kit from a reputable source, or create one yourself. This should include a first aid kit and commonly needed medicines.  ITEMS TO INCLUDE IN A TRAVEL HEALTH KIT:   Insect repellent containing DEET.   Bed nets impregnated with permethrin. (Can be purchased in camping or [employer] supply stores. Overseas, permethrin or another insecticide, deltamethrin, may be purchased to treat bed nets and clothes.)   Flying-insect spray or mosquito coils, to help clear rooms of mosquitoes. The product should contain a pyrethroid insecticide. These insecticides quickly kill flying insects, including mosquitoes.   Over-the-counter anti-diarrhea medicine,  to take if you have diarrhea.   Iodine tablets and water filters to purify water, if bottled water is not available.   Sunblock and sunglasses.   Antibacterial hand wipes or an alcohol-based hand sanitizer. Must contain at least 60% alcohol.   Extra pair of contacts or prescription glasses, or both, for people who wear corrective lenses.   Prescription medicines. Make sure you have enough to last during your trip, as well as a copy of the prescription(s).   Destination-related medicines, if applicable:   Anti-malaria medicines.   Medicine to prevent or treat high-altitude illness.   Pain or fever medicines (acetaminophen, aspirin, ibuprofen).   Stomach upset or diarrhea medicines:   Over-the-counter anti-diarrhea medicine (loperamide, bismuth subsalicylate).   Antibiotic for self-treatment of moderate to severe diarrhea.   Oral rehydration solution packets.   Mild laxative.   Antacid.   Items to treat throat and respiratory symptoms:   Antihistamine.   Decongestant, alone or combined with antihistamine.   Cough suppressant or expectorant (promotes the expulsion of mucus).   Throat lozenges.   Anti-motion sickness medicine.   Epinephrine auto-injector (such as an EpiPen), if you have a history of severe allergic reaction. Smaller dose packages are available for children.   Any medicines, prescription or over-the-counter, taken on a regular basis at home.  For Basic First Aid   Disposable gloves (at least two pairs).   Adhesive bandages, multiple sizes.   Gauze.   Adhesive tape.   Elastic bandage wrap for sprains and strains.   Antiseptic.   Cotton swabs.   Tweezers.   Scissors.   Antifungal and antibacterial ointments or creams.   1% hydrocortisone cream.   Anti-itch gel or cream, for insect bites and stings.   Aloe gel for sunburns.   Moleskin or molefoam for blisters.   Digital thermometer.   Saline eye drops.   First-aid quick reference card.   Commercial suture and syringe  kits, to be used by a local caregiver. (These items will also require a letter from the prescribing physician, on official letterhead stationery.)  Note that some of the above items are considered sharp. They will need to be packed in your checked luggage, not in a carry on.   AFTER YOU RETURN HOME:  If you have visited a malaria risk area, continue taking your anti-malaria drug for 4 weeks (chloroquine, doxycycline, or mefloquine) or 7 days (atovaquone/proguanil) after leaving the risk  area. Malaria is always a serious disease and may be a deadly illness. If you become ill with a fever or flu-like illness, while traveling or after you return home (for up to 1 year), you should seek immediate medical attention. Tell the caregiver your travel history.  This information is courtesy of the Center for Disease Control (CDC).   Document Released: 11/10/2004 Document Revised: 02/21/2012 Document Reviewed: 09/29/2009  [facility] Patient Information 2015 [place], [place]. This information is not intended to replace advice given to you by your health care provider. Make sure you discuss any questions you have with your health care provider.

## 2017-04-11 NOTE — Nursing Note (Signed)
Patient Name: Jason Kennedy  MRN# [id number]  DOB: Mar 21, 1996       Danny Lawless is a 21 y.o. who [facility] administration/placement of typhoid for travel requirements. No past vaccine reactions recalled.      Screening Checklist for Contraindications to Vaccines for Adults   1. Are you sick today?  No  2. Do you have allergies to medications, food, a vaccine component, or latex?  No  3. Have you ever had a serious reaction after receiving a vaccination?  No  4. Do you have a long-term health problem with heart disease, lung disease, asthma, kidney disease, metabolic disease (e.g., diabetes), anemia, or other blood disorder? No  5. Do you have cancer, leukemia, HIV/AIDS, or any other immune system problem?  No  6. In the past 3 months, have you taken medications that weaken your immune system, such as cortisone, prednisone, other steroids, or anticancer drugs, or have you had radiation treatments? No  7. Have you had a seizure or a brain or other nervous system problem? No  8. During the past year, have you received a transfusion of blood or blood products, or been given immune (gamma) globulin or an antiviral drug? No  9. For women: Are you pregnant or is there a chance you could become pregnant during the next month?  No  10. Have you received any vaccinations in the past 4 weeks? No  Did you bring your immunization record card with you?   It is important for you to have a personal record of your vaccinations. Yes  BP 120/88  Pulse 51  Temp 36.8 C (98.3 F) (Thermal Scan)   Resp 18  Ht 1.778 m ( )  Wt 70.2 kg (154 lb 12.8 oz)  SpO2 97%  BMI 22.21 kg/m2  No LMP for male patient.  Immunization administered     Name Date Dose VIS Date Route    TYPHOID VACCINE INJECTION (ADMIN) 04/11/2017 0.5 mL 05/11/2011 Intramuscular    Site: Left deltoid    Given By: Margart Sickles, RN    Manufacturer: [employer]    Lot: [id number]    NDC: [id number]          He was instructed to  please  remain 20 minutes after the injection and to report a reaction to the nurse as soon as he suspects it.  He was also given written instructions.  Margart Sickles, RN 04/11/2017, 13:55

## 2017-04-11 NOTE — Progress Notes (Signed)
[facility]  [address]  [place] [address]  [phone]  Travel Clinic    Patient Name:  Jason Kennedy       MRN:  [id number]   DOB: 1995/12/30   Date of Service:  04/11/2017     Date of Departure: 04/16/17  Destination and [place] of Visit: [place] [exact itinerary unclear - likely [place], [place], [place], and [place]]  Length of Stay: 12 day   Living Area: Urban  Living Styles:Hotel    Allergies: No Known Allergies  Allergic to chicken feathers/eggs:no    History reviewed. No pertinent past medical history.  Past Medical History was reviewed and is negative for asthma, diabetes, Sickle Cell anemia    Past Surgical History was reviewed and is negative for cardiac or pulmonary surgeries      Family Medical History     Problem Relation (Age of Onset)    Healthy Father, Brother, Mother            Social History   Substance Use Topics   . Smoking status: Never Smoker   . Smokeless tobacco: Never Used   . Alcohol use No       Current Medications:  Outpatient Prescriptions Marked as Taking for the 04/11/17 encounter (Office Visit) with Ladonna Snide, MD   Medication Sig   . Azithromycin (ZITHROMAX) 500 mg Oral Tablet Take 1 tab daily for up to 3 days if needed for traveler's diarrhea.       Immunizations:  Immunization History   Administered Date(s) Administered   . TYPHOID VACCINE INJECTION (ADMIN) 04/11/2017       Is Patient currently under the care of a physician: no  Patient has a history of : N/A  Does patient feel ill today: No    Patients gender:  Male   Vitals:    04/11/17 1257   BP: 120/88   Pulse: 51   Resp: 18   Temp: 36.8 C (98.3 F)   TempSrc: Thermal Scan   SpO2: 97%   Weight: 70.2 kg (154 lb 12.8 oz)   Height: 1.778 m ('5\' 10"'$ )     Body mass index is 22.21 kg/(m^2).  No LMP for male patient.      Instructions/Counseling: Food & Water Precautions, Mosquito/Insect Precautions and Safety and Security  Patient understands all counseling and risks of travel and medication.       Handouts: Travax- specific to destination and Culturegram- specific to destination     Immunizations:   Immunization History   Administered Date(s) Administered   . TYPHOID VACCINE INJECTION (ADMIN) 04/11/2017       Additional Information: Vaccines given today:   Typhoid- IM    Orders Placed This Encounter   . TYPHOID VACCINE -INJECTION(ADMIN)   . Azithromycin (ZITHROMAX) 500 mg Oral Tablet       (Z71.89) Counseling for travel  (primary encounter diagnosis)  Plan: TYPHOID VACCINE -INJECTION(ADMIN), Azithromycin        (ZITHROMAX) 500 mg Oral Tablet    No need YF vax.  Patient uncertain if he's had Hep A series; will have him follow up on this.  IM Typhoid vax today.  Declines seasonal flu shot.  UTD Hep B.  UTD MMR.  No need IPV booster.  Declines rabies series.  Declines Japanese encephalitis series.  Declines cholera (we don't stock vaccine in clinic anyway).  Patient uncertain if he's UTD on TdaP; will have him follow up on this.  No need PCV.  Had chickenpox  illness.  Declines malaria prophylaxis. Encouraged DEET-containing bug-spray.  #3 tab azithromycin for traveler's diarrhea. Standard precautions/instructions reviewed.      Recommended Follow-Up Instructions: Check with PCP to see if he's had Hep A series (x2) and appropriately-timed TdaP booster.    Billing level for today's visit [id number]    Ladonna Snide, MD 04/11/2017, 15:00

## 2017-08-09 ENCOUNTER — Ambulatory Visit (INDEPENDENT_AMBULATORY_CARE_PROVIDER_SITE_OTHER): Payer: PRIVATE HEALTH INSURANCE

## 2017-08-09 ENCOUNTER — Encounter (INDEPENDENT_AMBULATORY_CARE_PROVIDER_SITE_OTHER): Payer: Self-pay

## 2017-08-09 ENCOUNTER — Encounter (FREE_STANDING_LABORATORY_FACILITY)
Admit: 2017-08-09 | Discharge: 2017-08-09 | Disposition: A | Discharge status: Home / Self Care | Payer: PRIVATE HEALTH INSURANCE | Attending: Emergency Medicine | Admitting: Emergency Medicine

## 2017-08-09 VITALS — BP 116/76 | HR 111 | Temp 98.9°F | Resp 14 | Ht 70.0 in | Wt 163.0 lb

## 2017-08-09 DIAGNOSIS — R300 Dysuria: Secondary | ICD-10-CM

## 2017-08-09 MED ORDER — CIPROFLOXACIN 500 MG TABLET
500.00 mg | ORAL_TABLET | Freq: Two times a day (BID) | ORAL | 0 refills | Status: AC
Start: 2017-08-09 — End: 2017-08-16
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
## 2017-08-09 NOTE — Patient Instructions (Addendum)
[facility]     [facility]  [address], [place] [address]  Phone: [phone]  Fax: [phone]  [email or web address]  Twitter [email or web address]   Closed all  Holidays      Thank you for choosing [place] to provide your medical care today.  To combat your suspected urinary tract infection (UTI), stay fluid hydrated. (In your case, this may be even a kidney infection. This can land people in the hospital, so please let us know if you're starting to feel worse!) Drink pure cranberry juice if you have a taste for it; otherwise, try the extract (pills). Take the prescribed antibiotic. Please let us know if you're not getting better in the coming 48-72 hours, or sooner if you're getting worse (eg, fevers, vomiting, flank/back pain).  Please seek reevaluation if your symptoms persist or for any other concerns.    Attending Caregiver: [facility]    Today's orders:   Orders Placed This Encounter   . URINE CULTURE   . NEISSERIA GONORRHOEAE DNA BY PCR   . CHLAMYDIA TRACHOMITIS DNA BY PCR (INHOUSE)   . POCT AUTOMATED RACK URINE WITHOUT MICROSCOPY   . ciprofloxacin HCl (CIPRO) 500 mg Oral Tablet         Prescription(s) E-Rx to:  [facility] - [place], [place] - [address]    Future appts with [facility]  Visit date not found    PLEASE ACTIVATE YOUR [facility] MYCHART. THIS IS ONE EASY WAY TO COMMUNICATE WITH [facility].  SEE THE CODE ON THIS FORM FOR ACCESS.    -----------------------------PRIVACY INFORMATION-----------------------------------------------  As a [employer] [place] student, regardless of your age, we cannot discuss your personal health information with a parent, spouse, family member or anyone else without your expressed consent.    This policy does not include:  Individuals who would have a legitimate reason to access your records and information to assist in your care under the provisions of HIPAA [facility]  Portability and Accountability Act) law;   Individuals with whom you have previously given expressed written consent to do so, such a legal guardian or Power of [address].    No one can access your [facility], unless you give them your account sign on and password. You will receive emails from [facility].  This means anyone who has access to the email account you provided can see this notification. There will be no private medical information included in these emails. This notification of  new medical information  available in your [facility], may be information that you do not want others to know.   _______________________________________________________________________    If your symptoms persist, worsen or you develop any new or concerning symptoms please call [facility] for at [phone] for a follow up appointment.   If your symptoms are severe, go immediately to the emergency department or call 911.  Please check with your health insurance coverage for these types of visits.   Please keep all follow up visit recommended at today's visit.    If you received x-rays during your visit, be aware that the final and formal interpretation of those films by a radiologist will occur after your discharge.  If there is a significant discrepancy identified after your discharge, we will contact you at the telephone number provided during registration.  These results are available for your review on [facility].    Please refer to your [facility] for lab test results. Lab test results related to HIV, STI's  and pregnancy are considered private and are therefore not immediately visible in your [facility], we may still be able to send a generic MyChart message for these results.  If you have cultures pending for sexually transmitted diseases, you will be contacted by phone.     Positive cultures are reported to the [facility] Department of Health, as required by state law. These results are considered private on [facility] and  can only be released by the provider.We will not contact you if the results are negative.    It is very important that we have a phone number that is the single best way to contact you in the event that we become aware of important clinical information or concerns after your discharge.  If the phone number you provided at registration is NOT this number you should inform staff and registration prior to leaving.     Please call [facility] at [phone] with any further questions.    Instructions discussed with patient upon discharge by clinical staff with all questions answered.    Alessandra Grout, MD 08/09/2017, 10:12        Dysuria with Uncertain Cause (Adult)    The urethra is the tube that allows urine to pass out of the body. In a woman, the urethra is the opening above the vagina. In men, the urethra is the opening on the tip of the penis. Dysuria is the feeling of pain or burning in the urethra when passing urine.  Dysuria can be caused by anything that irritates or inflames the urethra. An infection or chemical irritation can cause this reaction. A bladder infection is the most common cause of dysuria in adults. A urine test can diagnose this. A bladder infection needs antibiotic treatment.  Soaps, lotions, colognes and feminine hygiene products can cause dysuria. So canbirth control jellies, creams, and foams. It will go away 1 to 3 days after using these irritants.  Sexually transmitted diseases (STDs) such as chlamydia or gonorrhea can cause dysuria. Your healthcare provider may take a culture sample. Your provider may start you on antibiotic medicine before the culture test returns.  In women who have gone through menopause, dysuria can be from dryness in the lining of the urethra. This can be treated with hormones. Dysuria becomes long-term (chronic) when it lasts for weeks or months. You may need to seea specialist (urologist)to diagnose and treat chronic dysuria.  Home care  These home  care tips may help:   Don't use any chemicals or productsthat you think may be causing your symptoms.   If you were given a prescription medicine, take as directed. Be sure to take it until it is all used up.   If a culture was taken, don't havesex until you have been told that it is negative. This means you don't have aninfection. Then follow your healthcare provider's advice to treat your condition.  If a culture was done and it is positive:   Both you and your sexual partner may need to be treated. This is true even if your partner has no symptoms.   Contact your healthcare provider or go to an urgent care clinic or the public health department to be looked at and treated.   Don't have sex until both you and your partner(s) have finished all antibiotics and your healthcare provider says you are no longer contagious.   Learn about and use safe sex practices. The safest sex is with a partner who has tested negative and only has  sex with you. Condoms can prevent STDs from spreading, but they aren't a guarantee.  Follow-up care  Follow up with your healthcare provider, oras advised. If a culture was taken, you may call as directed forthe results. If you have an STD, follow up with your provider or the public health department for a complete STD screening, including HIV testing. For more information, contact CDC-INFO at 2208318110.  When to seek medical advice  Call your healthcare provider right away ifany of these occur:   You aren't betterafter 3 days of treatment   Fever of 100.64F (38C) or higher, or as directed by your healthcare provider   Back or belly pain that gets worse   You can'turinate because of pain   New discharge from the urethra, vagina, or penis   Painful sores on the penis   Rash or joint pain   Painful lumps (lymph nodes) in the groin   Testicle pain or swelling of the scrotum  Date Last Reviewed: 10/14/2015   2000-2017 [employer] [address]  [place], [place] [address]. All rights reserved. This information is not intended as a substitute for professional medical care. Always follow your healthcare professional's instructions.        Pyelonephritis or Kidney Infection(Adult Male)  An infection of one or both kidneys is called pyelonephritis. It usually happens when bacteria (or rarely, viruses, fungi, or other disease-causing organisms) get into the kidneys. The bacteria (or other disease-causing organisms) can enter the kidneys from the bladder or blood traveling from other parts of the body to cause pyelonephritis. A kidney infection can become serious. It can cause severe illness, scarring of the kidneys, or kidney failure if not treated properly.  Common causes include:   Not keeping the genital area clean and dry, which promotes the growth of bacteria   Wearing tight pants or underwear, which allows moisture to build up in the genital area, helping bacteria grow   Holding urine for long periods of time   Dehydration  Kidney infections can cause symptoms similar to bladder infections. The infection can cause one or more of these symptoms:   Pain or burning when urinating   Having to urinate more often than usual   Blood in urine (pink or red)   Belly pain or discomfort, usually in the lower abdomen   Pain in the side or back   Pain above the pubic bone   Fever or chills   Vomiting   Loss of appetite  Treatment is oral antibiotics, or in more severe cases, intramuscular or intravenous (IV) antibiotics. These are started right away. Treatment helps prevent a more serious kidney infection.  Medicines  Medicines can help in the treatment of kidney infection:   Take antibiotics until they are used up, even if you feel better. It is important to finish them to make sure the infection is gone.   Unless another medicine was given, you can use over-the-counter medicines for pain, fever, or discomfort. If you have chronic liver or kidney disease, talk  with your healthcare provider before taking these medicines. Also tell your provider if you've ever had a stomach ulcer of gastrointestinal (GI) bleeding, or are taking blood thinners.  Home care  The following guidelines can help you care for yourself at home:   Stay home from work or school. Rest in bed until your fever breaks and you are feeling better, or as advised by your healthcare provider.   Drink lots of fluid unless  you must restrict fluids for other medical reasons. This will force the medicine into your urinary system and help flush the bacteria out of your body. Ask your healthcare provider how much you should drink.   Avoid sex until you have finished all of your medicine and your symptoms are gone.   Avoid caffeine, alcohol, and spicy foods. These foods may irritate the kidneys and bladder.   Wear loose-fitting clothes and cotton underwear.  Prevention  These self-care steps can help prevent future infections:   Drink plenty of fluids to prevent dehydration and flush out the bladder. Do this unless you must restrict your fluids for other health reasons, or your healthcare provider told you not to.   Proper cleaning after going to the bathroom is important.   Clean your penis regularly. If you aren't circumcised, pull back the foreskin when cleaning.   Urinate more often. Don't try to hold urine in for a long time.   Avoid tight-fitting pants and underwear.   Improve your diet and prevent constipation. Eat more fresh fruit, vegetables, and fiber. Eat less junk and fatty foods. Constipation can increase your chance of getting a urinary tract infection. Talk with your healthcare provider if you have trouble with bowel movements.   Urinate right after sex to flush out the bladder.  Follow-up care  Follow up with your healthcare provider, or as advised. Additional testing may be needed to make sure the infection is clearing. Close follow-up and further testing is very important to find the  cause and to prevent future infections.  If a urine culture was done, you will be contacted if your treatment needs to be changed. If directed, you can call to find out the results.  If you had an X-ray, CT scan, or another diagnostic test, you will be notified of any new findings that may affect your care.  Call 911  Call 911 if any of the following occur:   Trouble breathing   Fainting or loss of consciousness   Rapid or very slow heart rate   Weakness, dizziness, or fainting   Difficulty arousing or confusion  When to seek medical advice  Call your healthcare provider right away if any of the following occur:   Fever of 100.4 F (38 C) or higher, or as directed by your healthcare provider   Not feeling better within 1 to 2 days after starting antibiotics   Any symptom that continues after 3 days of treatment   Increasing pain in the stomach, back, side, or groin area   Repeated vomiting   Not able to take prescribed medicine due to nausea or another reason   Bloody, dark-colored, or foul smelling urine   Trouble urinating or decreased urine output   No urine for 8 hours, no tears when crying, sunken eyes, or dry mouth  Date Last Reviewed: 09/13/2015   2000-2017 [employer] [place]. [address] [place], [place] [address]. All rights reserved. This information is not intended as a substitute for professional medical care. Always follow your healthcare professional's instructions.

## 2017-08-10 LAB — NEISSERIA GONORRHOEAE DNA BY PCR: NEISSERIA GONORRHOEAE PCR: NOT DETECTED

## 2017-08-10 LAB — URINE CULTURE: URINE CULTURE: NO GROWTH

## 2017-08-10 LAB — CHLAMYDIA TRACHOMITIS DNA BY PCR (INHOUSE): CHLAMYDIA TRACHOMATIS PCR: NOT DETECTED
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
## 2018-10-16 ENCOUNTER — Ambulatory Visit (INDEPENDENT_AMBULATORY_CARE_PROVIDER_SITE_OTHER): Payer: PRIVATE HEALTH INSURANCE

## 2018-10-16 DIAGNOSIS — Z23 Encounter for immunization: Secondary | ICD-10-CM

## 2018-10-16 NOTE — Patient Instructions (Signed)
2019-2020 Vaccine Information Statement    Vaccine Information Statement    Influenza (Flu) Vaccine (Inactivated or Recombinant): What You Need to Know    Many Vaccine Information Statements are available in Spanish and other languages. See www.immunize.org/vis  Hojas de informacin sobre vacunas estn disponibles en espaol y en muchos otros idiomas. Visite www.immunize.org/vis    1. Why get vaccinated?    Influenza vaccine can prevent influenza (flu).    Flu is a contagious disease that spreads around the [place] every year, usually between October and May. Anyone can get the flu, but it is more dangerous for some people. Infants and young children, people 65 years of age and older, pregnant women, and people with certain health conditions or a weakened immune system are at greatest risk of flu complications.    Pneumonia, bronchitis, sinus infections and ear infections are examples of flu-related complications. If you have a medical condition, such as heart disease, cancer or diabetes, flu can make it worse.    Flu can cause fever and chills, sore throat, muscle aches, fatigue, cough, headache, and runny or stuffy nose. Some people may have vomiting and diarrhea, though this is more common in children than adults.     Each year thousands of people in the [place] die from flu, and many more are hospitalized. Flu vaccine prevents millions of illnesses and flu-related visits to the doctor each year.    2. Influenza vaccines     CDC recommends everyone 6 months of age and older get vaccinated every flu season. Children 6 months through 8 years of age may need 2 doses during a single flu season.  Everyone else needs only 1 dose each flu season.    It takes about 2 weeks for protection to develop after vaccination.    There are many flu viruses, and they are always changing. Each year a new flu vaccine is made to protect against three or four viruses that are likely to cause disease in the upcoming flu  season. Even when the vaccine doesn't exactly match these viruses, it may still provide some protection.     Influenza vaccine does not cause flu.    Influenza vaccine may be given at the same time as other vaccines.    3. Talk with your health care provider    Tell your vaccine provider if the person getting the vaccine:  . Has had an allergic reaction after a previous dose of influenza vaccine, or has any severe, life-threatening allergies.   . Has ever had Guillain-Barr Syndrome (also called GBS).    In some cases, your health care provider may decide to postpone influenza vaccination to a future visit.    People with minor illnesses, such as a cold, may be vaccinated. People who are moderately or severely ill should usually wait until they recover before getting influenza vaccine.    Your health care provider can give you more information.    4. Risks of a reaction    . Soreness, redness, and swelling where shot is given, fever, muscle aches, and headache can happen after influenza vaccine.  . There may be a very small increased risk of Guillain-Barr Syndrome (GBS) after inactivated influenza vaccine (the flu shot).    Young children who get the flu shot along with pneumococcal vaccine (PCV13), and/or DTaP vaccine at the same time might be slightly more likely to have a seizure caused by fever. Tell your health care provider if a child who is   getting flu vaccine has ever had a seizure.    People sometimes faint after medical procedures, including vaccination. Tell your provider if you feel dizzy or have vision changes or ringing in the ears.    As with any medicine, there is a very remote chance of a vaccine causing a severe allergic reaction, other serious injury, or death.    5. What if there is a serious problem?    An allergic reaction could occur after the vaccinated person leaves the clinic. If you see signs of a severe allergic reaction (hives, swelling of the face and throat, difficulty breathing, a  fast heartbeat, dizziness, or weakness), call 9-1-1 and get the person to the nearest hospital.    For other signs that concern you, call your health care provider.    Adverse reactions should be reported to the Vaccine Adverse Event Reporting System (VAERS). Your health care provider will usually file this report, or you can do it yourself. Visit the VAERS website at www.vaers.hhs.gov or call 1-800-822-7967.  VAERS is only for reporting reactions, and VAERS staff do not give medical advice.    6. The National Vaccine Injury Compensation Program    The National Vaccine Injury Compensation Program (VICP) is a federal program that was created to compensate people who may have been injured by certain vaccines. Visit the VICP website at www.hrsa.gov/vaccinecompensation or call 1-800-338-2382 to learn about the program and about filing a claim. There is a time limit to file a claim for compensation.    7. How can I learn more?    . Ask your health care provider.   . Call your local or state health department.  . Contact the Centers for Disease Control and Prevention (CDC):  - Call 1-800-232-4636 (1-800-CDC-INFO) or  - Visit CDC's influenza website at www.cdc.gov/flu    Vaccine Information Statement (Interim)  Inactivated Influenza Vaccine   07/27/2018  42 U.S.C[place]  [id number]   Department of Health and Human Services  Centers for Disease Control and Prevention    Office Use Only

## 2018-10-16 NOTE — Nursing Note (Signed)
1. Are you 21 years of age or older? yes  2. Have you ever had a severe reaction to a flu shot? no  3. Are you allergic to eggs? no  4. Are you allergic to latex? no  5. Are you allergic to Thimerosol? no  6. Are you experiencing acute illness symptoms or have you been running a fever? no  7. Do you have a medical condition or taking medications that suppress your immune system? no  8. Have you ever had Guillain-Barre syndrome or other neurologic disorder? no  9. Are you pregnant or breastfeeding? no    Immunization administered     Name Date Dose VIS Date Route    INFLUENZA VACCINE IM 10/16/2018 0.5 mL 07/27/2018 Intramuscular    Site: Left deltoid    Given By: Rosita Kea, RTR    Manufacturer: [employer]oSmith[employer]    Lot: [id number]    NDC: [id number]          Cherry Wittwer, RTR 10/16/2018, 14:44

## 2020-07-07 DIAGNOSIS — Z23 Encounter for immunization: Secondary | ICD-10-CM

## 2023-07-05 ENCOUNTER — Other Ambulatory Visit (HOSPITAL_BASED_OUTPATIENT_CLINIC_OR_DEPARTMENT_OTHER): Payer: BLUE CROSS/BLUE SHIELD | Admitting: Gynecology

## 2023-07-05 ENCOUNTER — Encounter (HOSPITAL_BASED_OUTPATIENT_CLINIC_OR_DEPARTMENT_OTHER): Payer: Self-pay | Admitting: Student in an Organized Health Care Education/Training Program

## 2023-07-05 ENCOUNTER — Other Ambulatory Visit: Payer: Self-pay

## 2023-07-05 ENCOUNTER — Ambulatory Visit
Payer: BLUE CROSS/BLUE SHIELD | Attending: Student in an Organized Health Care Education/Training Program | Admitting: Student in an Organized Health Care Education/Training Program

## 2023-07-05 VITALS — BP 130/80 | HR 98 | Temp 97.5°F | Ht 70.0 in | Wt 192.0 lb

## 2023-07-05 DIAGNOSIS — R3589 Other polyuria: Secondary | ICD-10-CM

## 2023-07-05 DIAGNOSIS — Z1159 Encounter for screening for other viral diseases: Secondary | ICD-10-CM

## 2023-07-05 DIAGNOSIS — Z114 Encounter for screening for human immunodeficiency virus [HIV]: Secondary | ICD-10-CM

## 2023-07-05 DIAGNOSIS — Z0000 Encounter for general adult medical examination without abnormal findings: Secondary | ICD-10-CM

## 2023-07-05 DIAGNOSIS — Z23 Encounter for immunization: Secondary | ICD-10-CM | POA: Insufficient documentation

## 2023-07-05 DIAGNOSIS — R4589 Other symptoms and signs involving emotional state: Secondary | ICD-10-CM

## 2023-07-05 DIAGNOSIS — F418 Other specified anxiety disorders: Secondary | ICD-10-CM

## 2023-07-05 LAB — BASIC METABOLIC PANEL
ANION GAP: 8 mmol/L (ref 4–13)
BUN/CREA RATIO: 10 (ref 6–22)
BUN: 10 mg/dL (ref 8–25)
CALCIUM: 10.1 mg/dL (ref 8.6–10.2)
CHLORIDE: 104 mmol/L (ref 96–111)
CO2 TOTAL: 27 mmol/L (ref 22–30)
CREATININE: 1.03 mg/dL (ref 0.75–1.35)
ESTIMATED GFR - MALE: >90 mL/min/BSA (ref >=60)
GLUCOSE: 77 mg/dL (ref 65–125)
POTASSIUM: 4 mmol/L (ref 3.5–5.1)
SODIUM: 139 mmol/L (ref 136–145)

## 2023-07-05 LAB — URINALYSIS, MICROSCOPIC
RBCS: 1 /hpf (ref <6.0)
WBCS: 2 /hpf (ref <4.0)

## 2023-07-05 LAB — HIV1/HIV2 SCREEN, COMBINED ANTIGEN AND ANTIBODY: HIV SCREEN, COMBINED ANTIGEN & ANTIBODY: NEGATIVE

## 2023-07-05 LAB — HGA1C (HEMOGLOBIN A1C WITH EST AVG GLUCOSE)
ESTIMATED AVERAGE GLUCOSE: 105 mg/dL
HEMOGLOBIN A1C: 5.3 % (ref 4.0–5.6)

## 2023-07-05 LAB — URINALYSIS, MACROSCOPIC
BILIRUBIN: NEGATIVE mg/dL
BLOOD: NEGATIVE mg/dL
COLOR: NORMAL
GLUCOSE: NEGATIVE mg/dL
KETONES: NEGATIVE mg/dL
LEUKOCYTES: NEGATIVE WBCs/uL
NITRITE: NEGATIVE
PH: 6.5 (ref 5.0–8.0)
PROTEIN: NEGATIVE mg/dL
SPECIFIC GRAVITY: 1.018 (ref 1.005–1.030)
UROBILINOGEN: NEGATIVE mg/dL

## 2023-07-05 LAB — HEPATITIS C ANTIBODY SCREEN WITH REFLEX TO HCV PCR: HCV ANTIBODY QUALITATIVE: NEGATIVE

## 2023-07-05 MED ORDER — HYDROXYZINE HCL 10 MG TABLET
10.0000 mg | ORAL_TABLET | Freq: Three times a day (TID) | ORAL | 0 refills | Status: DC | PRN
Start: 2023-07-05 — End: 2023-08-01

## 2023-07-05 NOTE — Nursing Note (Signed)
Immunization administered       Name Date Dose VIS Date Route    Tetanus Toxoid/Diphtheria Toxoid/Acellular Pertussis Vaccine, Adsorbed 07/05/2023 0.5 mL 07/18/2020 Intramuscular    Site: Left deltoid    Given By: Dionne Milo, Ambulatory Care Assistant    Manufacturer: Gl[employer]oSmith[employer]    Lot: [id number]    NDC: 08657846962            Did patient have any prior reaction to any vaccine: No    The patient/ caregiver was given the opportunity to ask specific questions regarding vaccinations and review corresponding Vaccine Information Statements prior to administration of vaccines.     Dionne Milo, Ambulatory Care Assistant  07/05/2023, 08:55

## 2023-07-05 NOTE — Progress Notes (Signed)
CLINIC PROGRESS NOTE  FAMILY MEDICINE, [facility]  [address] [place] [address]    Jason Kennedy  Apr 18, 1996  [id number]    Date of Service: 07/05/2023  8:00 AM EDT    Chief complaint:   Chief Complaint   Patient presents with    Annual Wellness Exam    Establish Care     Subjective:   Jason Kennedy is a 27 y.o. male patient who presents to establish care. No significant medical history. No current medications.     He does have some ongoing concerns regarding anxiety. He has noticed having some anxiety for several years that has come and gone, but has noticed some increasing anxiety over the last 7 months. He notices it mostly when he feels trapped such as being in a car or sometimes in public settings. He does not notice daily generalized anxiety. He would like to undergo some type of treatment for this as it has become more and more bothersome.   He also notices some increasing urination. He has been concerned that this is also related to anxiety and he has started to develop some health anxiety as well.   He denies any increasing thirst or hunger. Denies dysuria, hematuria, or discoloration of his urine, fever, chills, flank pain, unexplained weight loss. He usually drinks about 20 oz of water 6 times per day. He feels that he pays more attention to his urinary habits as a result of his anxiety. No thoughts of self harm or harm to others.     REVIEW OF SYSTEMS:  ROS as above.     reports that he has never smoked. He has never used smokeless tobacco. He reports that he does not drink alcohol and does not use drugs.  - Occupation: Accounting and HR  -Alcohol Misuse: no  -Diet History: "healthy" diet  in general  -Exercise History: 3 days per week  -Sleep: 6-7 hours a night.   -STI Screening-Any known exposure/risk to STI's:  no  -Routine Eye and Dentist Visits :no    PHQ Questionnaire  Little interest or pleasure in doing things.: Several Days  Feeling down, depressed, or hopeless: Not at  all  PHQ 2 Total: 1  Trouble falling or staying asleep, or sleeping too much.: Not at all  Feeling tired or having little energy: Several Days  Poor appetite or overeating: Several Days  Feeling bad about yourself/ that you are a failure in the past 2 weeks?: Several Days  Trouble concentrating on things in the past 2 weeks?: Not at all  Moving/Speaking slowly or being fidgety or restless  in the past 2 weeks?: Not at all  Thoughts that you would be better off DEAD, or of hurting yourself in some way.: Not at all  PHQ 9 Total: 4  Interpretation of Total Score: 0-4 No depression                07/05/2023     9:01 AM   GAD-7 Questionnaire   Feeling nervous,anxious,on edge 2   Not being able to stop or control worrying 1   Worrying too much about different things 1   Trouble relaxing 1   Being so restless that it is hard to sit still 1   Becoming easily annoyed or irritable 1   Feeling afraid as if something awful might happen 0   How difficult have these problems made it for you to work, take care of things at home, or get  along with other people? Somewhat difficult   Gad-7 Score Total 7   Interpretation 5-9, mild anxiety              Health Maintenance Due   Topic Date Due    Hepatitis C screening  Never done    Hepatitis B Vaccine (2 of 3 - 3-dose series) 09/06/1997    HIV Screening  Never done    Covid-19 Vaccine (4 - 2023-24 season) 08/13/2022       Current Outpatient Medications   Medication Sig    hydrOXYzine HCL (ATARAX) 10 mg Oral Tablet Take 1 Tablet (10 mg total) by mouth Three times a day as needed for Anxiety for up to 30 days       Objective:   BP 130/80   Pulse 98   Temp 36.4 C (97.5 F) (Thermal Scan)   Ht 1.778 m (5\' 10" )   Wt 87.1 kg (192 lb 0.3 oz)   SpO2 97%   BMI 27.55 kg/m     General appearance: alert, no acute distress  HEENT: tympanic membranes non erythematous, non-bulging. No lymphadenopathy. Oral and nasal mucosa normal   Lungs: clear to auscultation bilaterally, no wheezes or  crackles  Heart: RRR, no murmur appreciated  Abdomen: soft, non-tender, non-distended, BS+, no guarding, no CVA tenderness   Extremities: atraumatic, no edema  Neuro: grossly normal, gait steady    Diabetes Monitors  A1C - Glucose - Lipids Microalbumin   No results for input(s): "HA1C", "POCTHA1C", "GLUCOSEFAST", "CHOLESTEROL", "HDLCHOL", "LDLCHOL", "LDLCHOLDIR", "TRIG" in the last 16109 hours. No results for input(s): "MICALBRNUR", "MICALBCRERAT" in the last 60454 hours.     Retinal Exam Date: Not Found DM Retinopathy - Negative Date  Last Foot Exam: Not Found      The ASCVD Risk score (Arnett DK, et al., 2019) failed to calculate for the following reasons:    The 2019 ASCVD risk score is only valid for ages 59 to 13    Assessment/Plan     ENCOUNTER DIAGNOSES     ICD-10-CM   1. Encounter for preventative adult health care examination  Z00.00   2. Situational anxiety  F41.8   3. Anxiety about health  R45.89   4. Polyuria  R35.89   5. Need for hepatitis C screening test  Z11.59   6. Screening for HIV (human immunodeficiency virus)  Z11.4     Situational anxiety, health anxiety  -discussed treatment options including medication and therapy  -patient would prefer to try an as needed medication as he does not feel anxiety symptoms daily. Start hydroxyzine prn, advised to not take more than 3x/day   -patient interested in started therapy, resources provided for local counselors     Polyuria  -based on water intake and urination amount today, likely normal  -will check UA with reflex culture as well as labs including BMP and a1c   -consider additional workup as needed     -Colorectal cancer: Patient less than 92 yrs old without other indications for colon cancer screening.  -Lung Cancer >50 yr age screen if 20 pack yr current or former quit in past 15 yrs  N\A  -Osteoporosis (14 and older or postmenopausal <65 with risk factor): N/a  -Vit D testing (if community dwelling or high fall risk)No.    Recommended screening  guidelines discussed with patient and shared decision making completed.   - Age appropriate screening completed/ordered today.   -Patient counseled today about the following preventive/treatment guideline for her age as  above: diet,exercise, health body weight, and alcohol use recommendations.   -Recommend a minimum of thirty minutes of moderate exercise (ie walking at a brisk pace, bicycling, etc.) 4-5 days per week to prevent weight gain from age-related loss of metabolism.  Recommend increasing the intensity (ie running, jogging, and/or resistance training) and/or the duration of time spent exercising to facilitate weight loss and improve cardiac and respiratory fitness.  - reviewed recommended vaccinations, patient at this time.     Health Maintenance: Pending and Last Completed         Date Due Completion Date    Hepatitis C screening Never done ---    Hepatitis B Vaccine (2 of 3 - 3-dose series) 09/06/1997 08/09/1997    HIV Screening Never done ---    Covid-19 Vaccine (4 - 2023-24 season) 08/13/2022 10/01/2020    Influenza Vaccine (1) 08/14/2023 10/01/2020    Depression Screening 07/04/2024 07/05/2023    NonMedicare Preventative Exam 07/04/2024 07/05/2023    Adult Tdap-Td (8 - Td or Tdap) 07/04/2033 07/05/2023            Orders Placed This Encounter    Tdap Vaccine (Boostrix) >= 10 yrs    URINALYSIS, MACROSCOPIC AND MICROSCOPIC W/CULTURE REFLEX    BASIC METABOLIC PANEL    HGA1C (HEMOGLOBIN A1C WITH EST AVG GLUCOSE)    HEPATITIS C ANTIBODY SCREEN WITH REFLEX TO HCV PCR    HIV1/HIV2 SCREEN, COMBINED ANTIGEN AND ANTIBODY    URINALYSIS, MACROSCOPIC    URINALYSIS, MICROSCOPIC    hydrOXYzine HCL (ATARAX) 10 mg Oral Tablet     The patient was given the opportunity to ask questions and those questions were answered to the patient's satisfaction. The patient was encouraged to call with any additional questions or concerns. Instructed patient to call back if symptoms worsen.   Discussed with patient effects and side effects  of medications. Medication safety was discussed. A copy of the patient's medication list was printed and given to the patient. A good faith effort was made to reconcile the patient's medications.   This note may have been partially generated using MModal Fluency Direct system and there may be some incorrect words, spellings, and punctuation that were not noted in checking the note before saving.     Follow up:  Return in about 6 weeks (around 08/16/2023) for anxiety follow up.      Chrisandra Netters, DO   07/05/2023, 08:17

## 2023-07-18 ENCOUNTER — Encounter (HOSPITAL_BASED_OUTPATIENT_CLINIC_OR_DEPARTMENT_OTHER): Payer: Self-pay | Admitting: Student in an Organized Health Care Education/Training Program

## 2023-07-19 ENCOUNTER — Other Ambulatory Visit (HOSPITAL_BASED_OUTPATIENT_CLINIC_OR_DEPARTMENT_OTHER): Payer: Self-pay | Admitting: Student in an Organized Health Care Education/Training Program

## 2023-07-19 ENCOUNTER — Other Ambulatory Visit: Payer: Self-pay

## 2023-07-19 MED ORDER — SERTRALINE 25 MG TABLET
25.0000 mg | ORAL_TABLET | Freq: Every day | ORAL | 0 refills | Status: DC
Start: 2023-07-19 — End: 2023-08-11
  Filled 2023-07-19: qty 30, 30d supply, fill #0

## 2023-07-19 NOTE — Telephone Encounter (Signed)
Patient is asking to start a daily anxiety medication from last visit    Last visit 07/05/23  Situational anxiety, health anxiety  -discussed treatment options including medication and therapy  -patient would prefer to try an as needed medication as he does not feel anxiety symptoms daily. Start hydroxyzine prn, advised to not take more than 3x/day   -patient interested in started therapy, resources provided for local counselors    Patient uses [facility].     Suzanne Boron, RN

## 2023-08-01 ENCOUNTER — Other Ambulatory Visit (HOSPITAL_BASED_OUTPATIENT_CLINIC_OR_DEPARTMENT_OTHER): Payer: Self-pay | Admitting: Student in an Organized Health Care Education/Training Program

## 2023-08-01 NOTE — Telephone Encounter (Signed)
[facility]   Family Medicine Dept.     Refill Request:  Pharmacy requests refill of: Hydroxyzine 10mg .    Patient's Last Visit With [facility]: 07/05/2023    Patient's Last My Telemed/ My Chart Visit: Visit date not found     Patient's Next Visit At [facility] (Family Medicine) : 08/16/2023    Has The Patient Been Seen Within The Last Year: Yes    Is There An Upcoming Appointment Scheduled: Yes    Requested [facility] For Med[facility]    Last Order Date: 07/05/23    Medication refill request have been pended for appropriate provider approval.     Brunetta Jeans, Ambulatory Care Assistant 08/01/2023, 08:54

## 2023-08-11 ENCOUNTER — Other Ambulatory Visit (HOSPITAL_BASED_OUTPATIENT_CLINIC_OR_DEPARTMENT_OTHER): Payer: Self-pay | Admitting: Student in an Organized Health Care Education/Training Program

## 2023-08-11 MED ORDER — SERTRALINE 25 MG TABLET
25.0000 mg | ORAL_TABLET | Freq: Every day | ORAL | 0 refills | Status: DC
Start: 2023-08-11 — End: 2023-08-23

## 2023-08-11 NOTE — Telephone Encounter (Signed)
Refill Request: [facility]  Patient requests refill of  Zoloft 25 mg.    1.  Last visit at [facility]:  07/05/2023  2.  Last My Telemed/ My Chart Visit: Visit date not found   3.  Next pending visit [facility]: 08/16/2023  4.  Was last visit within the past year: Yes  5.  If not seen within the last year was appointment scheduled: N/A  6.  [facility]    Medication pended to provider for approval.    Dionne Milo, Ambulatory Care Assistant 08/11/2023, 11:32

## 2023-08-16 ENCOUNTER — Encounter (HOSPITAL_BASED_OUTPATIENT_CLINIC_OR_DEPARTMENT_OTHER): Payer: Self-pay

## 2023-08-16 ENCOUNTER — Ambulatory Visit (HOSPITAL_BASED_OUTPATIENT_CLINIC_OR_DEPARTMENT_OTHER): Payer: BLUE CROSS/BLUE SHIELD | Admitting: Student in an Organized Health Care Education/Training Program

## 2023-08-23 ENCOUNTER — Ambulatory Visit
Payer: BLUE CROSS/BLUE SHIELD | Attending: Student in an Organized Health Care Education/Training Program | Admitting: Student in an Organized Health Care Education/Training Program

## 2023-08-23 ENCOUNTER — Encounter (HOSPITAL_BASED_OUTPATIENT_CLINIC_OR_DEPARTMENT_OTHER): Payer: Self-pay | Admitting: Student in an Organized Health Care Education/Training Program

## 2023-08-23 ENCOUNTER — Other Ambulatory Visit: Payer: Self-pay

## 2023-08-23 VITALS — BP 116/74 | HR 88 | Temp 96.9°F | Ht 70.0 in | Wt 191.8 lb

## 2023-08-23 DIAGNOSIS — R4589 Other symptoms and signs involving emotional state: Secondary | ICD-10-CM | POA: Insufficient documentation

## 2023-08-23 DIAGNOSIS — F411 Generalized anxiety disorder: Secondary | ICD-10-CM | POA: Insufficient documentation

## 2023-08-23 DIAGNOSIS — F418 Other specified anxiety disorders: Secondary | ICD-10-CM | POA: Insufficient documentation

## 2023-08-23 MED ORDER — SERTRALINE 25 MG TABLET
25.0000 mg | ORAL_TABLET | Freq: Every day | ORAL | 0 refills | Status: DC
Start: 2023-08-23 — End: 2023-10-11

## 2023-08-23 NOTE — Progress Notes (Signed)
CLINIC PROGRESS NOTE  FAMILY MEDICINE, [facility]  [address] [place] [address]    Jason Kennedy  1996-11-10  [id number]    Date of Service: 08/23/2023  7:40 AM EDT    Chief complaint:   Chief Complaint   Patient presents with    Medication Check F/U     Subjective:   Jason Kennedy is a 27 y.o. male patient who presents for anxiety follow up. At previous visit we started hydroxyzine to take as needed.  He has used this about 3 times, he does notice a benefit from the medication in highly stressful situations.  We also decided to start Zoloft after his last visit.  He has been on Zoloft 25 mg for about 3 weeks.  He has noticed improvement in his anxiety overall and would like to continue on this medication.  No thoughts of self-harm or harm to others.  He did notice some slight stomach upset the 1st week after being on Zoloft, however this has improved.  No other questions or concerns today.    REVIEW OF SYSTEMS:  ROS as above.    PHQ Questionnaire  Little interest or pleasure in doing things.: Not at all  Feeling down, depressed, or hopeless: Not at all  PHQ 2 Total: 0  Trouble falling or staying asleep, or sleeping too much.: Several Days  Feeling tired or having little energy: Not at all  Poor appetite or overeating: Not at all  Feeling bad about yourself/ that you are a failure in the past 2 weeks?: Not at all  Trouble concentrating on things in the past 2 weeks?: Several Days  Moving/Speaking slowly or being fidgety or restless  in the past 2 weeks?: Not at all  Thoughts that you would be better off DEAD, or of hurting yourself in some way.: Not at all  PHQ 9 Total: 2  Interpretation of Total Score: 0-4 No depression              07/05/2023     9:01 AM 08/23/2023     7:00 AM   GAD-7 Questionnaire   Feeling nervous,anxious,on edge 2 1   Not being able to stop or control worrying 1 1   Worrying too much about different things 1 1   Trouble relaxing 1 1   Being so restless that it is hard to sit  still 1 1   Becoming easily annoyed or irritable 1 0   Feeling afraid as if something awful might happen 0 1   How difficult have these problems made it for you to work, take care of things at home, or get along with other people? Somewhat difficult Somewhat difficult   Gad-7 Score Total 7 6   Interpretation 5-9, mild anxiety 5-9, mild anxiety                Health Maintenance Due   Topic Date Due    Hepatitis B Vaccine (2 of 3 - 3-dose series) 09/06/1997    Influenza Vaccine (1) 08/14/2023    Covid-19 Vaccine (4 - 2023-24 season) 08/14/2023       Current Outpatient Medications   Medication Sig    hydrOXYzine HCL (ATARAX) 10 mg Oral Tablet TAKE 1 TABLET (10 MG TOTAL) BY MOUTH THREE TIMES A DAY AS NEEDED FOR ANXIETY FOR UP TO 30 DAYS    sertraline (ZOLOFT) 25 mg Oral Tablet Take 1 Tablet (25 mg total) by mouth Once a day  Objective:   BP 116/74   Pulse 88   Temp 36.1 C (96.9 F) (Thermal Scan)   Ht 1.778 m (5\' 10" )   Wt 87 kg (191 lb 12.8 oz)   SpO2 97%   BMI 27.52 kg/m       General appearance: alert, no acute distress  Lungs: clear to auscultation bilaterally, no wheezes or crackles  Heart: RRR, no murmur appreciated  Abdomen: soft, non-tender, non-distended, BS+, no guarding  Extremities: atraumatic, no edema  Neuro: grossly normal, gait steady      Assessment/Plan     ENCOUNTER DIAGNOSES     ICD-10-CM   1. GAD (generalized anxiety disorder)  F41.1   2. Situational anxiety  F41.8   3. Anxiety about health  R45.89     Anxiety symptoms improving on Zoloft 25 mg, continue.  Continue hydroxyzine p.r.n..  Will follow up again in 6 weeks for mood check.    Orders Placed This Encounter    sertraline (ZOLOFT) 25 mg Oral Tablet         The patient was given the opportunity to ask questions and those questions were answered to the patient's satisfaction. The patient was encouraged to call with any additional questions or concerns. Instructed patient to call back if symptoms worsen.   Discussed with patient  effects and side effects of medications. Medication safety was discussed. A copy of the patient's medication list was printed and given to the patient. A good faith effort was made to reconcile the patient's medications.   This note may have been partially generated using MModal Fluency Direct system and there may be some incorrect words, spellings, and punctuation that were not noted in checking the note before saving.     Follow up:  Return in about 6 weeks (around 10/04/2023) for depression/anxiety follow up.      Chrisandra Netters, DO   08/23/2023, 07:37

## 2023-10-04 ENCOUNTER — Ambulatory Visit (HOSPITAL_BASED_OUTPATIENT_CLINIC_OR_DEPARTMENT_OTHER): Payer: Self-pay | Admitting: Student in an Organized Health Care Education/Training Program

## 2023-10-11 ENCOUNTER — Encounter (HOSPITAL_BASED_OUTPATIENT_CLINIC_OR_DEPARTMENT_OTHER): Payer: Self-pay

## 2023-10-11 ENCOUNTER — Other Ambulatory Visit (HOSPITAL_BASED_OUTPATIENT_CLINIC_OR_DEPARTMENT_OTHER): Payer: Self-pay | Admitting: Student in an Organized Health Care Education/Training Program

## 2023-10-11 NOTE — Telephone Encounter (Signed)
Refill Request: [facility]    Patient requests refill of Zoloft 25.    Last visit at [facility]:  08/23/2023  2.   Last My Telemed/ My Chart Visit: Visit date not found   3.  Next pending visit [facility]: 11/30/2023  4.  Was last visit within the past year: yes  5.  If not seen within the last year was appointment scheduled: na  6.  [facility]    Medication pended to provider for approval.    Suzanne Boron, RN 10/11/2023, 14:30

## 2023-11-30 ENCOUNTER — Other Ambulatory Visit: Payer: Self-pay

## 2023-11-30 ENCOUNTER — Encounter (HOSPITAL_BASED_OUTPATIENT_CLINIC_OR_DEPARTMENT_OTHER): Payer: Self-pay | Admitting: Student in an Organized Health Care Education/Training Program

## 2023-11-30 ENCOUNTER — Ambulatory Visit
Payer: BC Managed Care – PPO | Attending: Student in an Organized Health Care Education/Training Program | Admitting: Student in an Organized Health Care Education/Training Program

## 2023-11-30 VITALS — BP 118/78 | HR 71 | Temp 97.3°F | Ht 70.0 in | Wt 198.9 lb

## 2023-11-30 DIAGNOSIS — Z09 Encounter for follow-up examination after completed treatment for conditions other than malignant neoplasm: Secondary | ICD-10-CM

## 2023-11-30 DIAGNOSIS — Z1331 Encounter for screening for depression: Secondary | ICD-10-CM | POA: Insufficient documentation

## 2023-11-30 DIAGNOSIS — Z79899 Other long term (current) drug therapy: Secondary | ICD-10-CM | POA: Insufficient documentation

## 2023-11-30 DIAGNOSIS — F411 Generalized anxiety disorder: Secondary | ICD-10-CM | POA: Insufficient documentation

## 2023-11-30 DIAGNOSIS — Z23 Encounter for immunization: Secondary | ICD-10-CM | POA: Insufficient documentation

## 2023-11-30 NOTE — Progress Notes (Signed)
CLINIC PROGRESS NOTE  FAMILY MEDICINE, [facility]  [address] [place] [address]    RANDEN MINIARD  12-09-1996  [id number]    Date of Service: 11/30/2023  7:40 AM EST    Chief complaint:   Chief Complaint   Patient presents with    Follow Up       Subjective:   Jason Kennedy is a 27 y.o. male patient who presents for mood check follow-up.    Has been doing well on Zoloft 25 mg.  He has been under some increased stress lately.  He recently got engaged.  He has also interviewing for a new job.  Holidays tend to be stressful for him as well.  He feels that he has had some increased stress but does not feel it is necessarily anxiety related.  He does feel that the Zoloft helps him and would like to continue on the current dosage as is.  He has noticed a twitch in his left eye over the last few days.  He did have some poor sleep recently after getting sick with an upper respiratory infection as well.    Otherwise he is feeling well.  No questions or concerns today.    REVIEW OF SYSTEMS:  ROS as above.    PHQ Questionnaire  Little interest or pleasure in doing things.: Not at all  Feeling down, depressed, or hopeless: Not at all  PHQ 2 Total: 0  Trouble falling or staying asleep, or sleeping too much.: Not at all  Feeling tired or having little energy: Several Days  Poor appetite or overeating: Not at all  Feeling bad about yourself/ that you are a failure in the past 2 weeks?: Not at all  Trouble concentrating on things in the past 2 weeks?: Not at all  Moving/Speaking slowly or being fidgety or restless  in the past 2 weeks?: Not at all  Thoughts that you would be better off DEAD, or of hurting yourself in some way.: Not at all  PHQ 9 Total: 1  Interpretation of Total Score: 0-4 No depression                07/05/2023     9:01 AM 08/23/2023     7:00 AM 11/30/2023     8:00 AM   GAD-7 Questionnaire   Feeling nervous,anxious,on edge 2 1 1    Not being able to stop or control worrying 1 1 1    Worrying too  much about different things 1 1 1    Trouble relaxing 1 1 1    Being so restless that it is hard to sit still 1 1 0   Becoming easily annoyed or irritable 1 0 2   Feeling afraid as if something awful might happen 0 1 0   How difficult have these problems made it for you to work, take care of things at home, or get along with other people? Somewhat difficult Somewhat difficult Somewhat difficult   Gad-7 Score Total 7 6 6    Interpretation 5-9, mild anxiety 5-9, mild anxiety 5-9, mild anxiety     There are no preventive care reminders to display for this patient.    Current Outpatient Medications   Medication Sig    sertraline (ZOLOFT) 25 mg Oral Tablet TAKE 1 TABLET BY MOUTH ONCE A DAY     Objective:   BP 118/78   Pulse 71   Temp 36.3 C (97.3 F) (Thermal Scan)   Ht 1.778 m (5\' 10" )  Wt 90.2 kg (198 lb 13.7 oz)   SpO2 97%   BMI 28.53 kg/m     General appearance: alert, no acute distress  Lungs: clear to auscultation bilaterally, no wheezes or crackles  Heart: RRR, no murmur appreciated  Abdomen: soft, non-tender, non-distended, BS+, no guarding  Extremities: atraumatic, no edema  Neuro: grossly normal, gait steady    Diabetes Monitors  A1C - Glucose - Lipids Microalbumin   Results in Last 18 Months   Lab Test 07/05/23  0902   HA1C 5.3    No results for input(s): "MICALBRNUR", "MICALBCRERAT" in the last 69629 hours.     Retinal Exam Date: Not Found DM Retinopathy - Negative Date  Last Foot Exam: Not Found    The ASCVD Risk score (Arnett DK, et al., 2019) failed to calculate for the following reasons:    The 2019 ASCVD risk score is only valid for ages 59 to 5    Assessment/Plan     ENCOUNTER DIAGNOSES     ICD-10-CM   1. Follow-up exam, 3-6 months since previous exam  Z09   2. GAD (generalized anxiety disorder)  F41.1     GAD  -doing well on Zoloft 25mg , continue   -patient to let me know if he would like to adjust the medication at any time  -eye twitching likely related to stress/poor sleep, recommended to try  magnesium.  Patient to let me know if twitching does not resolve    Follow up in July for wellness or sooner as needed.     Orders Placed This Encounter    Hepatitis B Virus Recomb Vaccine  Adult (Admin)     The patient was given the opportunity to ask questions and those questions were answered to the patient's satisfaction. The patient was encouraged to call with any additional questions or concerns. Instructed patient to call back if symptoms worsen.   Discussed with patient effects and side effects of medications. Medication safety was discussed. A copy of the patient's medication list was printed and given to the patient. A good faith effort was made to reconcile the patient's medications.   This note may have been partially generated using M[employer] Fluency Direct system and there may be some incorrect words, spellings, and punctuation that were not noted in checking the note before saving.     Follow up:  Return in about 31 weeks (around 07/04/2024) for 40 minute, annual exam.    Chrisandra Netters, DO   11/30/2023, 07:38

## 2024-01-03 ENCOUNTER — Other Ambulatory Visit (HOSPITAL_BASED_OUTPATIENT_CLINIC_OR_DEPARTMENT_OTHER): Payer: Self-pay | Admitting: Student in an Organized Health Care Education/Training Program

## 2024-01-03 NOTE — Telephone Encounter (Signed)
[facility]   Family Medicine Dept.     Refill Request:  Pharmacy requests refill of: Zoloft 25mg .    Patient's Last Visit With [facility]: 11/30/2023    Patient's Last My Telemed/ My Chart Visit: Visit date not found     Patient's Next Visit At [facility] (Family Medicine) : 07/09/2024    Has The Patient Been Seen Within The Last Year: Yes    Is There An Upcoming Appointment Scheduled: Yes    Requested Pharmacy For Medication: [facility] [place]    Last Order Date of Medication: 10/11/23  Medication refill request have been pended for appropriate provider approval.     Brunetta Jeans, Ambulatory Care Assistant 01/03/2024, 10:19

## 2024-04-07 ENCOUNTER — Other Ambulatory Visit (HOSPITAL_BASED_OUTPATIENT_CLINIC_OR_DEPARTMENT_OTHER): Payer: Self-pay | Admitting: Family

## 2024-04-09 NOTE — Telephone Encounter (Signed)
 Refill Request: Jason Kennedy [facility]  Pharmacy requests refill of Zoloft .    1.  Last visit at [facility]:  11/30/2023  2.  Last My Telemed/ My Chart Visit: Visit date not found   3.  Next pending visit [facility]: 07/09/2024  4.  Was last visit within the past year: yes  5.  If not seen within the last year was appointment scheduled: na  6.  Requested Pharmacy: [facility] [place]      Medication pended to provider for approval.    Anner Bars, MA 04/09/2024, 07:19

## 2024-07-08 ENCOUNTER — Other Ambulatory Visit (HOSPITAL_BASED_OUTPATIENT_CLINIC_OR_DEPARTMENT_OTHER): Payer: Self-pay | Admitting: Student in an Organized Health Care Education/Training Program

## 2024-07-09 ENCOUNTER — Ambulatory Visit (HOSPITAL_BASED_OUTPATIENT_CLINIC_OR_DEPARTMENT_OTHER): Payer: Self-pay | Admitting: Student in an Organized Health Care Education/Training Program

## 2024-07-09 NOTE — Telephone Encounter (Signed)
 Refill Request: [facility] Bigness [facility]  Pharmacy requests refill of Zoloft .    1.  Last visit at [facility]:  11/30/2023  2.  Last My Telemed/ My Chart Visit: Visit date not found   3.  Next pending visit [facility]: 07/09/2024  4.  Was last visit within the past year: Yes  5.  If not seen within the last year was appointment scheduled: N/A  6.  [facility]ed [facility]: [facility]    Medication pended to provider for approval.    Truman Jacquet, Ambulatory Care Assistant 07/09/2024, 07:31

## 2024-10-07 ENCOUNTER — Other Ambulatory Visit (HOSPITAL_BASED_OUTPATIENT_CLINIC_OR_DEPARTMENT_OTHER): Payer: Self-pay | Admitting: Student in an Organized Health Care Education/Training Program

## 2024-10-08 NOTE — Telephone Encounter (Signed)
 Refill Request: [facility]  Pharmacy requests refill of Zoloft .    1.  Last visit at [facility]:  11/30/2023  2.  Last My Telemed/ My Chart Visit: 07/08/2024 McCranie, Carly, DO   3.  Next pending visit [facility]: Visit date not found  4.  Was last visit within the past year: Yes  5.  If not seen within the last year was appointment scheduled: N/A  6.  Requested Pharmacy: [facility]    Medication pended to provider for approval.    Chella Chapdelaine, LPN [id number]/7974, 07:18

## 2025-01-05 ENCOUNTER — Other Ambulatory Visit (HOSPITAL_BASED_OUTPATIENT_CLINIC_OR_DEPARTMENT_OTHER): Payer: Self-pay | Admitting: Student in an Organized Health Care Education/Training Program

## 2025-01-07 ENCOUNTER — Encounter (HOSPITAL_BASED_OUTPATIENT_CLINIC_OR_DEPARTMENT_OTHER): Payer: Self-pay

## 2025-01-07 NOTE — Telephone Encounter (Signed)
 Appt needed

## 2025-01-10 ENCOUNTER — Other Ambulatory Visit (HOSPITAL_BASED_OUTPATIENT_CLINIC_OR_DEPARTMENT_OTHER): Payer: Self-pay | Admitting: Student in an Organized Health Care Education/Training Program

## 2025-01-10 MED ORDER — SERTRALINE 25 MG TABLET: 25.0000 mg | ORAL_TABLET | Freq: Every day | ORAL | 0 refills | Status: AC

## 2025-01-25 ENCOUNTER — Ambulatory Visit (HOSPITAL_BASED_OUTPATIENT_CLINIC_OR_DEPARTMENT_OTHER): Admitting: Student in an Organized Health Care Education/Training Program
